# Patient Record
Sex: Female | Born: 1970 | Race: White | Hispanic: No | Marital: Single | Smoking: Never smoker
Health system: Southern US, Community
[De-identification: ages and names within clinical notes are randomized; demographics above are authoritative.]

## PROBLEM LIST (undated history)

## (undated) DIAGNOSIS — K8050 Calculus of bile duct without cholangitis or cholecystitis without obstruction: Secondary | ICD-10-CM

## (undated) DIAGNOSIS — R3915 Urgency of urination: Secondary | ICD-10-CM

## (undated) DIAGNOSIS — Z973 Presence of spectacles and contact lenses: Secondary | ICD-10-CM

## (undated) DIAGNOSIS — K8020 Calculus of gallbladder without cholecystitis without obstruction: Secondary | ICD-10-CM

## (undated) DIAGNOSIS — R350 Frequency of micturition: Secondary | ICD-10-CM

## (undated) DIAGNOSIS — R112 Nausea with vomiting, unspecified: Secondary | ICD-10-CM

## (undated) DIAGNOSIS — Z9889 Other specified postprocedural states: Secondary | ICD-10-CM

## (undated) HISTORY — PX: OTHER SURGICAL HISTORY: SHX169

---

## 2016-07-21 ENCOUNTER — Emergency Department (HOSPITAL_BASED_OUTPATIENT_CLINIC_OR_DEPARTMENT_OTHER): Payer: BC Managed Care – PPO

## 2016-07-21 ENCOUNTER — Emergency Department (HOSPITAL_BASED_OUTPATIENT_CLINIC_OR_DEPARTMENT_OTHER)
Admission: EM | Admit: 2016-07-21 | Discharge: 2016-07-22 | Disposition: A | Discharge status: Home / Self Care | Payer: BC Managed Care – PPO | Attending: Emergency Medicine | Admitting: Emergency Medicine

## 2016-07-21 ENCOUNTER — Encounter (HOSPITAL_BASED_OUTPATIENT_CLINIC_OR_DEPARTMENT_OTHER): Payer: Self-pay

## 2016-07-21 DIAGNOSIS — K8020 Calculus of gallbladder without cholecystitis without obstruction: Secondary | ICD-10-CM

## 2016-07-21 DIAGNOSIS — K8021 Calculus of gallbladder without cholecystitis with obstruction: Secondary | ICD-10-CM | POA: Insufficient documentation

## 2016-07-21 DIAGNOSIS — R1084 Generalized abdominal pain: Secondary | ICD-10-CM | POA: Diagnosis present

## 2016-07-21 DIAGNOSIS — R079 Chest pain, unspecified: Secondary | ICD-10-CM | POA: Insufficient documentation

## 2016-07-21 DIAGNOSIS — K838 Other specified diseases of biliary tract: Secondary | ICD-10-CM | POA: Insufficient documentation

## 2016-07-21 LAB — CBC WITH DIFFERENTIAL/PLATELET
BASOS ABS: 0 10*3/uL (ref 0.0–0.1)
Basophils Relative: 0 %
EOS PCT: 0 %
Eosinophils Absolute: 0 10*3/uL (ref 0.0–0.7)
HEMATOCRIT: 38.3 % (ref 36.0–46.0)
Hemoglobin: 13.1 g/dL (ref 12.0–15.0)
LYMPHS ABS: 0.5 10*3/uL — AB (ref 0.7–4.0)
LYMPHS PCT: 4 %
MCH: 30.8 pg (ref 26.0–34.0)
MCHC: 34.2 g/dL (ref 30.0–36.0)
MCV: 89.9 fL (ref 78.0–100.0)
MONO ABS: 0.4 10*3/uL (ref 0.1–1.0)
MONOS PCT: 3 %
NEUTROS ABS: 11.6 10*3/uL — AB (ref 1.7–7.7)
Neutrophils Relative %: 93 %
PLATELETS: 209 10*3/uL (ref 150–400)
RBC: 4.26 MIL/uL (ref 3.87–5.11)
RDW: 13.4 % (ref 11.5–15.5)
WBC: 12.5 10*3/uL — ABNORMAL HIGH (ref 4.0–10.5)

## 2016-07-21 LAB — URINALYSIS, ROUTINE W REFLEX MICROSCOPIC
BILIRUBIN URINE: NEGATIVE
Glucose, UA: NEGATIVE mg/dL
Nitrite: NEGATIVE
PROTEIN: NEGATIVE mg/dL
SPECIFIC GRAVITY, URINE: 1.026 (ref 1.005–1.030)
pH: 6 (ref 5.0–8.0)

## 2016-07-21 LAB — COMPREHENSIVE METABOLIC PANEL
ALT: 25 U/L (ref 14–54)
AST: 25 U/L (ref 15–41)
Albumin: 4.3 g/dL (ref 3.5–5.0)
Alkaline Phosphatase: 70 U/L (ref 38–126)
Anion gap: 10 (ref 5–15)
BILIRUBIN TOTAL: 0.7 mg/dL (ref 0.3–1.2)
BUN: 15 mg/dL (ref 6–20)
CHLORIDE: 104 mmol/L (ref 101–111)
CO2: 23 mmol/L (ref 22–32)
CREATININE: 0.66 mg/dL (ref 0.44–1.00)
Calcium: 9 mg/dL (ref 8.9–10.3)
Glucose, Bld: 151 mg/dL — ABNORMAL HIGH (ref 65–99)
POTASSIUM: 3.7 mmol/L (ref 3.5–5.1)
Sodium: 137 mmol/L (ref 135–145)
TOTAL PROTEIN: 7.6 g/dL (ref 6.5–8.1)

## 2016-07-21 LAB — URINALYSIS, MICROSCOPIC (REFLEX)

## 2016-07-21 LAB — LIPASE, BLOOD: LIPASE: 19 U/L (ref 11–51)

## 2016-07-21 LAB — PREGNANCY, URINE: PREG TEST UR: NEGATIVE

## 2016-07-21 LAB — TROPONIN I: Troponin I: <0.03 ng/mL (ref <0.03)

## 2016-07-21 LAB — I-STAT CG4 LACTIC ACID, ED: LACTIC ACID, VENOUS: 1.2 mmol/L (ref 0.5–1.9)

## 2016-07-21 MED ORDER — MORPHINE SULFATE (PF) 4 MG/ML IV SOLN
4.0000 mg | Freq: Once | INTRAVENOUS | Status: AC
  Administered 2016-07-21: 4 mg via INTRAVENOUS
  Filled 2016-07-21: qty 1

## 2016-07-21 MED ORDER — ONDANSETRON HCL 4 MG/2ML IJ SOLN
4.0000 mg | Freq: Once | INTRAMUSCULAR | Status: AC
  Administered 2016-07-21: 4 mg via INTRAVENOUS
  Filled 2016-07-21: qty 2

## 2016-07-21 MED ORDER — IOPAMIDOL (ISOVUE-300) INJECTION 61%
100.0000 mL | Freq: Once | INTRAVENOUS | Status: AC | PRN
  Administered 2016-07-21: 100 mL via INTRAVENOUS

## 2016-07-21 NOTE — ED Notes (Signed)
Unsuccessful IV attempt in left FA.

## 2016-07-21 NOTE — ED Notes (Signed)
Patient returned from CT

## 2016-07-21 NOTE — ED Notes (Signed)
Oral Contrast completed

## 2016-07-21 NOTE — ED Notes (Addendum)
Unsuccessful attempt at IV right upper arm

## 2016-07-21 NOTE — ED Notes (Signed)
Patient states she was feeling bad at school today.  When she went home about 4 stated she was not feeling well.  About 5pm she started with N/V/D.  Also states she has extremely bad abd pain that startes in the epigastric area and travels to her mid abd area.  Patient tearful

## 2016-07-21 NOTE — ED Triage Notes (Signed)
C.o abd pain, n/v/d since 3pm-NAD-steady gait

## 2016-07-21 NOTE — ED Triage Notes (Signed)
Pt states no med hx-surgical hx "lots of reconstructive surgery" r/t to [facility] age 46

## 2016-07-21 NOTE — ED Provider Notes (Signed)
[facility] Note   CSN: [id number] Arrival date & time: 07/21/16  2039  By signing my name below, I, Modena Jansky, attest that this documentation has been prepared under the direction and in the presence of non-physician practitioner, Frederik Pear, PA-C. Electronically Signed: Modena Jansky, Scribe. 07/21/2016. 10:10 PM.  History   Chief Complaint Chief Complaint  Patient presents with  . Abdominal Pain   The history is provided by the patient. No language interpreter was used.   HPI Comments: Morgan Bowman is a 46 y.o. female who presents to the Emergency Department complaining of constant moderate central abdominal pain that started about 5 hours ago. She states she was feeling sick when she onset of pain then later associated nausea, vomiting, and diarrhea. Her pain improved some after vomiting and diarrhea. No hematemesis, bilious emesis, hematochezia, or melena. Her pain radiates to upward into her chest with undescribed pain quality. No h/o of similar pain. Denies any hx of abdominal surgeries, recent unusual foods, fever, chills, heart palpitations, SOB, dysuria, vaginal bleeding/discharge, rectal pain, numbness, weakness, headache, or other complaints at this time. No treatment PTA. No pertinent PMH. Allergies to medication include Demerol.   History reviewed. No pertinent past medical history.  There are no active problems to display for this patient.   Past Surgical History:  Procedure Laterality Date  . Facial Surgeries      OB History    No data available       Home Medications    Prior to Admission medications   Medication Sig Start Date End Date Taking? Authorizing Provider  HYDROcodone-acetaminophen (NORCO/VICODIN) 5-325 MG tablet Take 1 tablet by mouth every 6 (six) hours as needed. 07/22/16   Trevionne Advani A, PA-C  ondansetron (ZOFRAN) 4 MG tablet Take 1 tablet (4 mg total) by mouth every 6 (six) hours. 07/22/16   Galina Haddox A, PA-C     Family History No family history on file.  Social History Social History  Substance Use Topics  . Smoking status: Never Smoker  . Smokeless tobacco: Never Used  . Alcohol use Yes     Comment: occ     Allergies   Demerol [meperidine]   Review of Systems Review of Systems  Constitutional: Negative for activity change, chills and fever.  Respiratory: Negative for shortness of breath.   Cardiovascular: Positive for chest pain. Negative for palpitations.  Gastrointestinal: Positive for abdominal pain, diarrhea, nausea and vomiting. Negative for blood in stool and rectal pain.  Genitourinary: Negative for dysuria, vaginal bleeding and vaginal discharge.  Musculoskeletal: Negative for back pain.  Skin: Negative for rash.  Neurological: Negative for weakness, numbness and headaches.   Physical Exam Updated Vital Signs BP (!) 120/93 (BP Location: Left Arm)   Pulse 88   Temp 97.9 F (36.6 C) (Oral)   Resp 18   Ht 5\' 5"  (1.651 m)   Wt 218 lb (98.9 kg)   LMP 06/22/2016   SpO2 100%   BMI 36.28 kg/m   Physical Exam  Constitutional:  Uncomfortable appearing.   HENT:  Head: Normocephalic.  Mouth/Throat: Uvula is midline.  Mucous membranes appear dry.  Eyes: Conjunctivae are normal.  Neck: Neck supple.  Cardiovascular: Normal rate, regular rhythm, normal heart sounds and intact distal pulses.  Exam reveals no gallop and no friction rub.   No murmur heard. Pulmonary/Chest: Effort normal and breath sounds normal. No respiratory distress. She has no wheezes. She has no rales.  Abdominal: Soft. She exhibits no distension. There  is tenderness. There is no rebound and no guarding.  Diffuse, generalized TTP, worse over the epigastric area and LUQ. No CVA tenderness bilaterally.   Musculoskeletal: She exhibits no edema.  No mid-line bony tenderness to the cervical, thoracic, or lumbar spine or the surrounding paraspinal muscles. No bilateral lower extremity edema.    Neurological: She is alert.  Skin: Skin is warm. Capillary refill takes 2 to 3 seconds. No rash noted.  Psychiatric: Her behavior is normal.  Nursing note and vitals reviewed.  ED Treatments / Results  DIAGNOSTIC STUDIES: Oxygen Saturation is 100% on RA, normal by my interpretation.    COORDINATION OF CARE: 10:14 PM- Pt advised of plan for treatment and pt agrees.  Labs (all labs ordered are listed, but only abnormal results are displayed) Labs Reviewed  URINALYSIS, ROUTINE W REFLEX MICROSCOPIC - Abnormal; Notable for the following:       Result Value   APPearance CLOUDY (*)    Hgb urine dipstick LARGE (*)    Ketones, ur >80 (*)    Leukocytes, UA TRACE (*)    All other components within normal limits  URINALYSIS, MICROSCOPIC (REFLEX) - Abnormal; Notable for the following:    Bacteria, UA FEW (*)    Squamous Epithelial / LPF 6-30 (*)    All other components within normal limits  CBC WITH DIFFERENTIAL/PLATELET - Abnormal; Notable for the following:    WBC 12.5 (*)    Neutro Abs 11.6 (*)    Lymphs Abs 0.5 (*)    All other components within normal limits  COMPREHENSIVE METABOLIC PANEL - Abnormal; Notable for the following:    Glucose, Bld 151 (*)    All other components within normal limits  PREGNANCY, URINE  LIPASE, BLOOD  TROPONIN I  I-STAT CG4 LACTIC ACID, ED    EKG  EKG Interpretation  Date/Time:  Wednesday Jul 21 2016 22:55:45 EDT Ventricular Rate:  78 PR Interval:    QRS Duration: 87 QT Interval:  424 QTC Calculation: 483 R Axis:   38 Text Interpretation:  Sinus rhythm No previous ECGs available Confirmed by Vanetta MuldersZackowski, Scott [phone]([id number]) on 07/22/2016 11:16:42 AM       Radiology Ct Chest W Contrast  Result Date: 07/22/2016 CLINICAL DATA:  Acute onset of epigastric abdominal pain, nausea, vomiting and diarrhea. Initial encounter. EXAM: CT CHEST, ABDOMEN, AND PELVIS WITH CONTRAST TECHNIQUE: Multidetector CT imaging of the chest, abdomen and pelvis was performed  following the standard protocol during bolus administration of intravenous contrast. CONTRAST:  150mL ISOVUE-300 IOPAMIDOL (ISOVUE-300) INJECTION 61% COMPARISON:  None. FINDINGS: CT CHEST FINDINGS Cardiovascular: The heart is normal in size. The thoracic aorta is unremarkable in appearance. No calcific atherosclerotic disease is seen. There is no evidence of aortic injury. There is no evidence of venous hemorrhage. The great vessels are grossly unremarkable. Mediastinum/Nodes: The mediastinum is unremarkable in appearance. No mediastinal lymphadenopathy is seen. No pericardial effusion is identified. The thyroid gland is unremarkable. No axillary lymphadenopathy is appreciated. Lungs/Pleura: Minimal bibasilar atelectasis is noted. The lungs are otherwise clear. No pleural effusion or pneumothorax is seen. No masses are identified. There is no evidence of pulmonary parenchymal contusion. Musculoskeletal: No acute osseous abnormalities are identified. The visualized musculature is unremarkable in appearance. CT ABDOMEN PELVIS FINDINGS Hepatobiliary: The liver is unremarkable in appearance. There is mild distention of the common bile duct to 8 mm. An apparent 1.0 cm stone is seen at the cystic duct. Stones are noted dependently within the gallbladder. There is vague soft tissue inflammation  about the intrahepatic biliary ducts, raising question for cholangitis. Pancreas: The pancreas is within normal limits. Spleen: The spleen is unremarkable in appearance. Adrenals/Urinary Tract: The adrenal glands are unremarkable in appearance. The kidneys are within normal limits. There is no evidence of hydronephrosis. No renal or ureteral stones are identified. No perinephric stranding is seen. Stomach/Bowel: The stomach is unremarkable in appearance. The small bowel is within normal limits. The appendix is normal in caliber, without evidence of appendicitis. The colon is unremarkable in appearance. Vascular/Lymphatic: The  abdominal aorta is unremarkable in appearance. The inferior vena cava is grossly unremarkable. No retroperitoneal lymphadenopathy is seen. No pelvic sidewall lymphadenopathy is identified. Reproductive: The bladder is mildly distended and within normal limits. The uterus is grossly unremarkable in appearance. The ovaries are relatively symmetric. No suspicious adnexal masses are seen. Other: No additional soft tissue abnormalities are seen. Musculoskeletal: No acute osseous abnormalities are identified. The visualized musculature is unremarkable in appearance. IMPRESSION: 1. Mild distention of the common bile duct to 8 mm. Apparent 1.0 cm stone noted at the cystic duct, raising concern for intermittent obstruction. Cholelithiasis noted. Vague soft tissue inflammation about the intrahepatic biliary ducts raises concern for cholangitis. Would correlate with LFTs. Given distention of the common bile duct, MRCP or ERCP could be considered for further evaluation. 2. Minimal bibasilar atelectasis noted.  Lungs otherwise clear. Electronically Signed   By: Roanna Raider M.D.   On: 07/22/2016 01:26   Ct Abdomen Pelvis W Contrast  Result Date: 07/22/2016 CLINICAL DATA:  Acute onset of epigastric abdominal pain, nausea, vomiting and diarrhea. Initial encounter. EXAM: CT CHEST, ABDOMEN, AND PELVIS WITH CONTRAST TECHNIQUE: Multidetector CT imaging of the chest, abdomen and pelvis was performed following the standard protocol during bolus administration of intravenous contrast. CONTRAST:  ISOVUE-300 IOPAMIDOL (ISOVUE-300) INJECTION 61% COMPARISON:  None. FINDINGS: CT CHEST FINDINGS Cardiovascular: The heart is normal in size. The thoracic aorta is unremarkable in appearance. No calcific atherosclerotic disease is seen. There is no evidence of aortic injury. There is no evidence of venous hemorrhage. The great vessels are grossly unremarkable. Mediastinum/Nodes: The mediastinum is unremarkable in appearance. No  mediastinal lymphadenopathy is seen. No pericardial effusion is identified. The thyroid gland is unremarkable. No axillary lymphadenopathy is appreciated. Lungs/Pleura: Minimal bibasilar atelectasis is noted. The lungs are otherwise clear. No pleural effusion or pneumothorax is seen. No masses are identified. There is no evidence of pulmonary parenchymal contusion. Musculoskeletal: No acute osseous abnormalities are identified. The visualized musculature is unremarkable in appearance. CT ABDOMEN PELVIS FINDINGS Hepatobiliary: The liver is unremarkable in appearance. There is mild distention of the common bile duct to 8 mm. An apparent 1.0 cm stone is seen at the cystic duct. Stones are noted dependently within the gallbladder. There is vague soft tissue inflammation about the intrahepatic biliary ducts, raising question for cholangitis. Pancreas: The pancreas is within normal limits. Spleen: The spleen is unremarkable in appearance. Adrenals/Urinary Tract: The adrenal glands are unremarkable in appearance. The kidneys are within normal limits. There is no evidence of hydronephrosis. No renal or ureteral stones are identified. No perinephric stranding is seen. Stomach/Bowel: The stomach is unremarkable in appearance. The small bowel is within normal limits. The appendix is normal in caliber, without evidence of appendicitis. The colon is unremarkable in appearance. Vascular/Lymphatic: The abdominal aorta is unremarkable in appearance. The inferior vena cava is grossly unremarkable. No retroperitoneal lymphadenopathy is seen. No pelvic sidewall lymphadenopathy is identified. Reproductive: The bladder is mildly distended and within normal limits. The  uterus is grossly unremarkable in appearance. The ovaries are relatively symmetric. No suspicious adnexal masses are seen. Other: No additional soft tissue abnormalities are seen. Musculoskeletal: No acute osseous abnormalities are identified. The visualized musculature is  unremarkable in appearance. IMPRESSION: 1. Mild distention of the common bile duct to 8 mm. Apparent 1.0 cm stone noted at the cystic duct, raising concern for intermittent obstruction. Cholelithiasis noted. Vague soft tissue inflammation about the intrahepatic biliary ducts raises concern for cholangitis. Would correlate with LFTs. Given distention of the common bile duct, MRCP or ERCP could be considered for further evaluation. 2. Minimal bibasilar atelectasis noted.  Lungs otherwise clear. Electronically Signed   By: Roanna Raider M.D.   On: 07/22/2016 01:26    Procedures Procedures (including critical care time)  Medications Ordered in ED Medications  ondansetron [facility]) injection 4 mg (4 mg Intravenous Given 07/21/16 2203)  morphine 4 MG/ML injection 4 mg (4 mg Intravenous Given 07/21/16 2204)  iopamidol (ISOVUE-300) 61 % injection 100 mL (100 mLs Intravenous Contrast Given 07/21/16 2339)  morphine 4 MG/ML injection 4 mg (4 mg Intravenous Given 07/22/16 0012)  iopamidol (ISOVUE-300) 61 % injection 50 mL (50 mLs Intravenous Contrast Given 07/22/16 0034)  sodium chloride 0.9 % bolus 1,000 mL (0 mLs Intravenous Stopped 07/22/16 0223)     Initial Impression / Assessment and Plan / ED Course  I have reviewed the triage vital signs and the nursing notes.  Pertinent labs & imaging results that were available during my care of the patient were reviewed by me and considered in my medical decision making (see chart for details).     Patient with cholelithiasis and common bile duct dilatation noted on CT with concern for intermittent obstruction of the cystic duct. Transaminases and bililrubin are not elevated at this time. On initial exam, the patient appeared dehydrated. Much improved after IVF. Pain controlled with morphine. Nausea controlled with Zofran. Lab work is reassuring. VSS. Will d/c the patient to home with pain control and Zofran and follow up to GI and general surgery. Strict return  precautions given. The patient appears stable for discharge at this time.   Final Clinical Impressions(s) / ED Diagnoses   Final diagnoses:  Common bile duct dilatation  Cholelithiasis without cholecystitis    New Prescriptions Discharge Medication List as of 07/22/2016  1:53 AM    START taking these medications   Details  HYDROcodone-acetaminophen (NORCO/VICODIN) 5-325 MG tablet Take 1 tablet by mouth every 6 (six) hours as needed., Starting Thu 07/22/2016, Print    ondansetron (ZOFRAN) 4 MG tablet Take 1 tablet (4 mg total) by mouth every 6 (six) hours., Starting Thu 07/22/2016, Print       I personally performed the services described in this documentation, which was scribed in my presence. The recorded information has been reviewed and is accurate.     Frederik Pear A, PA-C 07/23/16 1324    Gwyneth Sprout, MD 07/23/16 Ernestina [place]

## 2016-07-22 MED ORDER — MORPHINE SULFATE (PF) 4 MG/ML IV SOLN
4.0000 mg | Freq: Once | INTRAVENOUS | Status: AC
  Administered 2016-07-22: 4 mg via INTRAVENOUS
  Filled 2016-07-22: qty 1

## 2016-07-22 MED ORDER — SODIUM CHLORIDE 0.9 % IV BOLUS (SEPSIS)
1000.0000 mL | Freq: Once | INTRAVENOUS | Status: AC
  Administered 2016-07-21: 1000 mL via INTRAVENOUS

## 2016-07-22 MED ORDER — IOPAMIDOL (ISOVUE-300) INJECTION 61%
50.0000 mL | Freq: Once | INTRAVENOUS | Status: AC | PRN
  Administered 2016-07-22: 50 mL via INTRAVENOUS

## 2016-07-22 MED ORDER — HYDROCODONE-ACETAMINOPHEN 5-325 MG PO TABS: 1.0000 | ORAL_TABLET | Freq: Four times a day (QID) | ORAL | 0 refills | Status: DC | PRN

## 2016-07-22 MED ORDER — ONDANSETRON HCL 4 MG PO TABS: 4.0000 mg | ORAL_TABLET | Freq: Four times a day (QID) | ORAL | 0 refills | Status: DC

## 2016-07-22 NOTE — ED Notes (Signed)
Patient states her pain is more constant now

## 2016-07-22 NOTE — ED Notes (Signed)
Patient ambulatory to the BR without difficulty 

## 2016-07-22 NOTE — ED Notes (Signed)
Discharge instructions and prescriptions reviewed - voiced understanding 

## 2016-08-13 ENCOUNTER — Ambulatory Visit: Payer: Self-pay | Admitting: Surgery

## 2016-08-13 ENCOUNTER — Encounter (HOSPITAL_BASED_OUTPATIENT_CLINIC_OR_DEPARTMENT_OTHER): Payer: Self-pay | Admitting: *Deleted

## 2016-08-13 NOTE — H&P (Signed)
Morgan Bowman 08/13/2016 1:39 PM Location: [facility] Patient #: [id number] DOB: 01/25/1971 Single / Language: English / Race: White Female  History of Present Illness (Morgan Bowman A. Delainee Tramel MD; 08/13/2016 2:11 PM) Patient words: Chief complaint: abdominal pain  This is an otherwise healthy 46-year-old woman who is referred from the emergency department for symptomatic cholelithiasis. She presented to the ER on May 30 with a 5 hour history of constant moderate central abdominal pain. This was associated with nausea, vomiting and diarrhea. The pain did improve somewhat. The pain radiated upward to her chest. She had never had any prior similar symptoms. After this she went on a cruise and when she returned she had another bout of pain which is actually lasted for the majority of this week, she feels better today actually. She's not had any fevers. She's been sticking to the brat diet. The pain sort of ebbs and wanes, she has not identified any particular triggers or aggravating factors.  Her workup in the ER included a CMP and CBC. She did have a mild leukocytosis to 12.5 with increased neutrophil component, but the remainder of her labs were unremarkable including bilirubin and liver enzymes; her urinalysis did shoe ketonuria. She underwent CT chest/abdomen/pelvis which showed mild distention of the common bile duct to 8 mm and a 1 cm stone at the cystic duct in addition to dependent stones in the gallbladder. They describe a vague soft tissue inflammation around the intrahepatic biliary ducts raising the question for cholangitis.  She has never had any abdominal surgeries. She is a highschool teacher. She is going on a field trip to [place] on July 9.  The patient is a 46 year old female.   Past Surgical History (Armen Glenn, CMA; 08/13/2016 1:39 PM) No pertinent past surgical history  Diagnostic Studies History (Armen Glenn, CMA; 08/13/2016 1:39 PM) Colonoscopy  never Mammogram never Pap Smear 1-5 years ago  Allergies (Armen Glenn, CMA; 08/13/2016 1:43 PM) Demerol *ANALGESICS - OPIOID* Rash.  Medication History (Armen Glenn, CMA; 08/13/2016 1:43 PM) Ondansetron HCl (4MG Tablet, Oral) Active. Hydrocodone-Acetaminophen (5-325MG Tablet, Oral) Active. Medications Reconciled  Social History (Armen Glenn, CMA; 08/13/2016 1:39 PM) Alcohol use Occasional alcohol use. Caffeine use Carbonated beverages. No drug use Tobacco use Never smoker.  Family History (Armen Glenn, CMA; 08/13/2016 1:39 PM) Family history unknown First Degree Relatives  Pregnancy / Birth History (Armen Glenn, CMA; 08/13/2016 1:39 PM) Age at menarche 13 years. Contraceptive History Oral contraceptives. Gravida 0 Para 0 Regular periods  Other Problems (Armen Glenn, CMA; 08/13/2016 1:39 PM) High blood pressure     Review of Systems (Zettie Gootee A. Kelby Lotspeich MD; 08/13/2016 2:06 PM) General Present- Appetite Loss and Weight Loss. Not Present- Chills, Fatigue, Fever, Night Sweats and Weight Gain. Skin Not Present- Change in Wart/Mole, Dryness, Hives, Jaundice, New Lesions, Non-Healing Wounds, Rash and Ulcer. HEENT Present- Seasonal Allergies. Not Present- Earache, Hearing Loss, Hoarseness, Nose Bleed, Oral Ulcers, Ringing in the Ears, Sinus Pain, Sore Throat, Visual Disturbances, Wears glasses/contact lenses and Yellow Eyes. Respiratory Not Present- Bloody sputum, Chronic Cough, Difficulty Breathing, Snoring and Wheezing. Breast Not Present- Breast Mass, Breast Pain, Nipple Discharge and Skin Changes. Cardiovascular Not Present- Chest Pain, Difficulty Breathing Lying Down, Leg Cramps, Palpitations, Rapid Heart Rate, Shortness of Breath and Swelling of Extremities. Gastrointestinal Present- Abdominal Pain, Bloating and Indigestion. Not Present- Bloody Stool, Change in Bowel Habits, Chronic diarrhea, Constipation, Difficulty Swallowing, Excessive gas, Gets full quickly at  meals, Hemorrhoids, Nausea, Rectal Pain and Vomiting. Female Genitourinary   Present- Painful Urination and Urgency. Not Present- Frequency, Nocturia and Pelvic Pain. Musculoskeletal Not Present- Back Pain, Joint Pain, Joint Stiffness, Muscle Pain, Muscle Weakness and Swelling of Extremities. Neurological Not Present- Decreased Memory, Fainting, Headaches, Numbness, Seizures, Tingling, Tremor, Trouble walking and Weakness. Psychiatric Not Present- Anxiety, Bipolar, Change in Sleep Pattern, Depression, Fearful and Frequent crying. Endocrine Not Present- Cold Intolerance, Excessive Hunger, Hair Changes, Heat Intolerance, Hot flashes and New Diabetes. Hematology Not Present- Blood Thinners, Easy Bruising, Excessive bleeding, Gland problems, HIV and Persistent Infections. All other systems negative  Vitals (Armen Glenn CMA; 08/13/2016 1:41 PM) 08/13/2016 1:40 PM Weight: 213 lb Height: 65in Body Surface Area: 2.03 m Body Mass Index: 35.44 kg/m  Temp.: 98.3F  Pulse: 120 (Regular)  Resp.: 97 (Unlabored)  BP: 116/92 (Sitting, Left Arm, Standard)      Physical Exam (Abel Hageman A. Lakelyn Straus MD; 08/13/2016 2:10 PM)  General Note: She is alert and well-appearing  Integumentary Note: Skin is warm and dry  Head and Neck Note: No mass or thyromegaly  Eye Note: No scleral icterus. Pupils equally round and reactive  ENMT Note: Several scars around her face. Normal hearing. Oral mucosa is moist and she has normal dentition  Chest and Lung Exam Note: Unlabored respirations, no tactile fremitus  Cardiovascular Note: Regular rate and rhythm. Palpable pedal pulses. No pedal edema  Abdomen Note: Soft, nontender, nondistended. No mass or organomegaly.  Neurologic Note: Grossly intact, normal gait.  Neuropsychiatric Note: Normal judgment and insight. Intact recent and remote memory.  Musculoskeletal Note: Normal gait. No clubbing or cyanosis. Normal range of motion and  muscle tone throughout.    Assessment & Plan (Clarabell Matsuoka A. Finnlee Silvernail MD; 08/13/2016 2:12 PM)  BILIARY COLIC (K80.50) Story: We discussed the natural history of gallstones and the possibilities of ongoing pain, cholecystitis, cholangitis or pancreatitis in the symptoms of each of these. I counseled her as to the technical details of laparoscopic cholecystectomy and advised her as to the risks of surgery including bleeding, pain, scarring, infection, intra-abdominal injury specifically to the common bile duct and sequelae, and conversion to open surgery. She is understanding and she had several insightful questions all of which were answered. Given her escalating symptoms I do believe that cholecystectomy should be pursued in the near future. We will try to get her scheduled in the next week or 2 

## 2016-08-13 NOTE — Progress Notes (Signed)
NPO AFTER MN.  ARRIVE AT 0930.  CURRENT LAB RESULTS AND EKG IN CHART AND EPIC.  MAY TAKE NORCO/ ZOFRAN IF NEEDED AM DOS W/ SIPS OF WATER.  PT VERBALIZED UNDERSTANDING TO SHOWER W/ HIBICLENS HS BEFORE AND AM DOS.

## 2016-08-16 ENCOUNTER — Encounter (HOSPITAL_BASED_OUTPATIENT_CLINIC_OR_DEPARTMENT_OTHER)
Admission: RE | Disposition: A | Discharge status: Home / Self Care | Payer: Self-pay | Source: Ambulatory Visit | Attending: Surgery

## 2016-08-16 ENCOUNTER — Encounter (HOSPITAL_BASED_OUTPATIENT_CLINIC_OR_DEPARTMENT_OTHER): Payer: Self-pay | Admitting: *Deleted

## 2016-08-16 ENCOUNTER — Ambulatory Visit (HOSPITAL_BASED_OUTPATIENT_CLINIC_OR_DEPARTMENT_OTHER)
Admission: RE | Admit: 2016-08-16 | Discharge: 2016-08-16 | Disposition: A | Discharge status: Home / Self Care | Payer: BC Managed Care – PPO | Source: Ambulatory Visit | Attending: Surgery | Admitting: Surgery

## 2016-08-16 ENCOUNTER — Ambulatory Visit (HOSPITAL_BASED_OUTPATIENT_CLINIC_OR_DEPARTMENT_OTHER): Payer: BC Managed Care – PPO | Admitting: Anesthesiology

## 2016-08-16 DIAGNOSIS — K8012 Calculus of gallbladder with acute and chronic cholecystitis without obstruction: Secondary | ICD-10-CM | POA: Diagnosis not present

## 2016-08-16 DIAGNOSIS — Z6835 Body mass index (BMI) 35.0-35.9, adult: Secondary | ICD-10-CM | POA: Diagnosis not present

## 2016-08-16 DIAGNOSIS — E669 Obesity, unspecified: Secondary | ICD-10-CM | POA: Insufficient documentation

## 2016-08-16 HISTORY — DX: Presence of spectacles and contact lenses: Z97.3

## 2016-08-16 HISTORY — DX: Calculus of bile duct without cholangitis or cholecystitis without obstruction: K80.50

## 2016-08-16 HISTORY — DX: Nausea with vomiting, unspecified: Z98.890

## 2016-08-16 HISTORY — DX: Calculus of gallbladder without cholecystitis without obstruction: K80.20

## 2016-08-16 HISTORY — DX: Other specified postprocedural states: R11.2

## 2016-08-16 HISTORY — DX: Urgency of urination: R39.15

## 2016-08-16 HISTORY — PX: CHOLECYSTECTOMY: SHX55

## 2016-08-16 HISTORY — DX: Frequency of micturition: R35.0

## 2016-08-16 LAB — POCT PREGNANCY, URINE: PREG TEST UR: NEGATIVE

## 2016-08-16 SURGERY — LAPAROSCOPIC CHOLECYSTECTOMY
Anesthesia: General | Site: Abdomen

## 2016-08-16 MED ORDER — ROCURONIUM BROMIDE 50 MG/5ML IV SOSY
PREFILLED_SYRINGE | INTRAVENOUS | Status: DC | PRN
  Administered 2016-08-16: 30 mg via INTRAVENOUS
  Administered 2016-08-16: 10 mg via INTRAVENOUS

## 2016-08-16 MED ORDER — DEXAMETHASONE SODIUM PHOSPHATE 10 MG/ML IJ SOLN
INTRAMUSCULAR | Status: AC
  Filled 2016-08-16: qty 1

## 2016-08-16 MED ORDER — ACETAMINOPHEN 500 MG PO TABS
1000.0000 mg | ORAL_TABLET | ORAL | Status: AC
  Administered 2016-08-16: 1000 mg via ORAL
  Filled 2016-08-16: qty 2

## 2016-08-16 MED ORDER — SCOPOLAMINE 1 MG/3DAYS TD PT72
1.0000 | MEDICATED_PATCH | TRANSDERMAL | Status: DC
  Administered 2016-08-16: 1.5 mg via TRANSDERMAL
  Filled 2016-08-16: qty 1

## 2016-08-16 MED ORDER — LIDOCAINE HCL 4 % MT SOLN
OROMUCOSAL | Status: DC | PRN
  Administered 2016-08-16: 2 mL via TOPICAL

## 2016-08-16 MED ORDER — AMOXICILLIN-POT CLAVULANATE 875-125 MG PO TABS: 1.0000 | ORAL_TABLET | Freq: Two times a day (BID) | ORAL | 0 refills | Status: AC

## 2016-08-16 MED ORDER — SODIUM CHLORIDE 0.9 % IV SOLN
250.0000 mL | INTRAVENOUS | Status: DC | PRN
  Filled 2016-08-16: qty 250

## 2016-08-16 MED ORDER — SUCCINYLCHOLINE CHLORIDE 200 MG/10ML IV SOSY
PREFILLED_SYRINGE | INTRAVENOUS | Status: AC
  Filled 2016-08-16: qty 10

## 2016-08-16 MED ORDER — SUCCINYLCHOLINE CHLORIDE 200 MG/10ML IV SOSY
PREFILLED_SYRINGE | INTRAVENOUS | Status: DC | PRN
  Administered 2016-08-16: 100 mg via INTRAVENOUS

## 2016-08-16 MED ORDER — ACETAMINOPHEN 500 MG PO TABS
ORAL_TABLET | ORAL | Status: AC
  Filled 2016-08-16: qty 2

## 2016-08-16 MED ORDER — ONDANSETRON HCL 4 MG/2ML IJ SOLN
INTRAMUSCULAR | Status: AC
  Filled 2016-08-16: qty 2

## 2016-08-16 MED ORDER — SUGAMMADEX SODIUM 200 MG/2ML IV SOLN
INTRAVENOUS | Status: AC
  Filled 2016-08-16: qty 2

## 2016-08-16 MED ORDER — LACTATED RINGERS IV SOLN
INTRAVENOUS | Status: DC
  Administered 2016-08-16 (×2): via INTRAVENOUS
  Filled 2016-08-16: qty 1000

## 2016-08-16 MED ORDER — SUGAMMADEX SODIUM 200 MG/2ML IV SOLN
INTRAVENOUS | Status: DC | PRN
  Administered 2016-08-16: 200 mg via INTRAVENOUS

## 2016-08-16 MED ORDER — CEFAZOLIN SODIUM-DEXTROSE 2-4 GM/100ML-% IV SOLN
2.0000 g | INTRAVENOUS | Status: AC
  Administered 2016-08-16: 2 g via INTRAVENOUS
  Filled 2016-08-16: qty 100

## 2016-08-16 MED ORDER — DOCUSATE SODIUM 100 MG PO CAPS: 100.0000 mg | ORAL_CAPSULE | Freq: Two times a day (BID) | ORAL | 0 refills | Status: AC

## 2016-08-16 MED ORDER — GABAPENTIN 300 MG PO CAPS
ORAL_CAPSULE | ORAL | Status: AC
Start: 2016-08-16 — End: ?
  Filled 2016-08-16: qty 1

## 2016-08-16 MED ORDER — SODIUM CHLORIDE 0.9% FLUSH
3.0000 mL | Freq: Two times a day (BID) | INTRAVENOUS | Status: DC
  Filled 2016-08-16: qty 3

## 2016-08-16 MED ORDER — KETOROLAC TROMETHAMINE 30 MG/ML IJ SOLN
30.0000 mg | Freq: Once | INTRAMUSCULAR | Status: DC | PRN
Start: 2016-08-16 — End: 2016-08-16
  Filled 2016-08-16: qty 1

## 2016-08-16 MED ORDER — PROMETHAZINE HCL 25 MG/ML IJ SOLN
6.2500 mg | INTRAMUSCULAR | Status: DC | PRN
  Filled 2016-08-16: qty 1

## 2016-08-16 MED ORDER — BUPIVACAINE-EPINEPHRINE 0.25% -1:200000 IJ SOLN
INTRAMUSCULAR | Status: DC | PRN
  Administered 2016-08-16: 30 mL

## 2016-08-16 MED ORDER — FENTANYL CITRATE (PF) 250 MCG/5ML IJ SOLN
INTRAMUSCULAR | Status: AC
  Filled 2016-08-16: qty 5

## 2016-08-16 MED ORDER — SCOPOLAMINE 1 MG/3DAYS TD PT72
MEDICATED_PATCH | TRANSDERMAL | Status: AC
  Filled 2016-08-16: qty 1

## 2016-08-16 MED ORDER — CELECOXIB 400 MG PO CAPS
400.0000 mg | ORAL_CAPSULE | ORAL | Status: AC
  Administered 2016-08-16: 400 mg via ORAL
  Filled 2016-08-16: qty 1

## 2016-08-16 MED ORDER — SODIUM CHLORIDE 0.9 % IR SOLN
Status: DC | PRN
  Administered 2016-08-16: 3000 mL

## 2016-08-16 MED ORDER — FENTANYL CITRATE (PF) 100 MCG/2ML IJ SOLN
INTRAMUSCULAR | Status: DC | PRN
  Administered 2016-08-16 (×6): 50 ug via INTRAVENOUS

## 2016-08-16 MED ORDER — CHLORHEXIDINE GLUCONATE 4 % EX LIQD
60.0000 mL | Freq: Once | CUTANEOUS | Status: DC
  Filled 2016-08-16: qty 118

## 2016-08-16 MED ORDER — OXYCODONE HCL 5 MG PO TABS
5.0000 mg | ORAL_TABLET | ORAL | Status: DC | PRN
  Filled 2016-08-16: qty 2

## 2016-08-16 MED ORDER — SODIUM CHLORIDE 0.9% FLUSH
3.0000 mL | INTRAVENOUS | Status: DC | PRN
  Filled 2016-08-16: qty 3

## 2016-08-16 MED ORDER — CELECOXIB 200 MG PO CAPS
ORAL_CAPSULE | ORAL | Status: AC
  Filled 2016-08-16: qty 2

## 2016-08-16 MED ORDER — ONDANSETRON HCL 4 MG/2ML IJ SOLN
INTRAMUSCULAR | Status: DC | PRN
Start: 2016-08-16 — End: 2016-08-16
  Administered 2016-08-16: 4 mg via INTRAVENOUS

## 2016-08-16 MED ORDER — HYDROMORPHONE HCL 1 MG/ML IJ SOLN
INTRAMUSCULAR | Status: AC
  Filled 2016-08-16: qty 0.5

## 2016-08-16 MED ORDER — HYDROCODONE-ACETAMINOPHEN 5-325 MG PO TABS: 1.0000 | ORAL_TABLET | Freq: Four times a day (QID) | ORAL | 0 refills | Status: DC | PRN

## 2016-08-16 MED ORDER — CEFAZOLIN SODIUM-DEXTROSE 2-4 GM/100ML-% IV SOLN
INTRAVENOUS | Status: AC
  Filled 2016-08-16: qty 100

## 2016-08-16 MED ORDER — MIDAZOLAM HCL 5 MG/5ML IJ SOLN
INTRAMUSCULAR | Status: DC | PRN
  Administered 2016-08-16: 2 mg via INTRAVENOUS

## 2016-08-16 MED ORDER — DEXAMETHASONE SODIUM PHOSPHATE 4 MG/ML IJ SOLN
INTRAMUSCULAR | Status: DC | PRN
  Administered 2016-08-16: 10 mg via INTRAVENOUS

## 2016-08-16 MED ORDER — PROPOFOL 10 MG/ML IV BOLUS
INTRAVENOUS | Status: AC
  Filled 2016-08-16: qty 40

## 2016-08-16 MED ORDER — MIDAZOLAM HCL 2 MG/2ML IJ SOLN
INTRAMUSCULAR | Status: AC
  Filled 2016-08-16: qty 2

## 2016-08-16 MED ORDER — ROCURONIUM BROMIDE 50 MG/5ML IV SOSY
PREFILLED_SYRINGE | INTRAVENOUS | Status: AC
  Filled 2016-08-16: qty 5

## 2016-08-16 MED ORDER — PROPOFOL 10 MG/ML IV BOLUS
INTRAVENOUS | Status: DC | PRN
Start: 2016-08-16 — End: 2016-08-16
  Administered 2016-08-16: 200 mg via INTRAVENOUS

## 2016-08-16 MED ORDER — ACETAMINOPHEN 325 MG PO TABS
650.0000 mg | ORAL_TABLET | ORAL | Status: DC | PRN
  Filled 2016-08-16: qty 2

## 2016-08-16 MED ORDER — GABAPENTIN 300 MG PO CAPS
300.0000 mg | ORAL_CAPSULE | ORAL | Status: AC
  Administered 2016-08-16: 300 mg via ORAL
  Filled 2016-08-16: qty 1

## 2016-08-16 MED ORDER — LIDOCAINE 2% (20 MG/ML) 5 ML SYRINGE
INTRAMUSCULAR | Status: DC | PRN
  Administered 2016-08-16: 80 mg via INTRAVENOUS

## 2016-08-16 MED ORDER — HYDROMORPHONE HCL 1 MG/ML IJ SOLN
0.2500 mg | INTRAMUSCULAR | Status: DC | PRN
  Administered 2016-08-16: 0.5 mg via INTRAVENOUS
  Filled 2016-08-16: qty 0.5

## 2016-08-16 MED ORDER — FENTANYL CITRATE (PF) 100 MCG/2ML IJ SOLN
25.0000 ug | INTRAMUSCULAR | Status: DC | PRN
  Filled 2016-08-16: qty 1

## 2016-08-16 MED ORDER — ACETAMINOPHEN 650 MG RE SUPP
650.0000 mg | RECTAL | Status: DC | PRN
  Filled 2016-08-16: qty 1

## 2016-08-16 MED FILL — AMOX TR-K CLV 875-125 MG TA: 875-125 | 7 days supply | Qty: 14 | Fill #0

## 2016-08-16 MED FILL — HYDROCODON-APAP 5-325: 5-325 | 7 days supply | Qty: 30 | Fill #0

## 2016-08-16 SURGICAL SUPPLY — 49 items
APPLIER CLIP 5 13 M/L LIGAMAX5 (MISCELLANEOUS)
APPLIER CLIP ROT 10 11.4 M/L (STAPLE) ×3
BAG RETRIEVAL 10 (BASKET) ×1
BAG RETRIEVAL 10MM (BASKET) ×1
CABLE HIGH FREQUENCY MONO STRZ (ELECTRODE) ×3 IMPLANT
CHLORAPREP W/TINT 26ML (MISCELLANEOUS) ×3 IMPLANT
CLIP APPLIE 5 13 M/L LIGAMAX5 (MISCELLANEOUS) IMPLANT
CLIP APPLIE ROT 10 11.4 M/L (STAPLE) ×1 IMPLANT
CLIP LIGATING HEMO LOK XL GOLD (MISCELLANEOUS) ×3 IMPLANT
COVER MAYO STAND STRL (DRAPES) IMPLANT
DECANTER SPIKE VIAL GLASS SM (MISCELLANEOUS) ×3 IMPLANT
DERMABOND ADVANCED (GAUZE/BANDAGES/DRESSINGS) ×8
DERMABOND ADVANCED .7 DNX12 (GAUZE/BANDAGES/DRESSINGS) ×4 IMPLANT
DEVICE PMI PUNCTURE CLOSURE (MISCELLANEOUS) IMPLANT
DRAPE C-ARM 42X72 X-RAY (DRAPES) ×3 IMPLANT
DRAPE UTILITY XL STRL (DRAPES) IMPLANT
GLOVE BIO SURGEON STRL SZ 6 (GLOVE) ×6 IMPLANT
GLOVE BIOGEL PI IND STRL 7.0 (GLOVE) ×1 IMPLANT
GLOVE BIOGEL PI IND STRL 7.5 (GLOVE) ×3 IMPLANT
GLOVE BIOGEL PI INDICATOR 7.0 (GLOVE) ×2
GLOVE BIOGEL PI INDICATOR 7.5 (GLOVE) ×6
GLOVE ECLIPSE 7.0 STRL STRAW (GLOVE) ×3 IMPLANT
GLOVE INDICATOR 6.5 STRL GRN (GLOVE) ×3 IMPLANT
GOWN STRL REUS W/TWL LRG LVL3 (GOWN DISPOSABLE) ×6 IMPLANT
GOWN STRL REUS W/TWL XL LVL3 (GOWN DISPOSABLE) ×3 IMPLANT
GRASPER SUT TROCAR 14GX15 (MISCELLANEOUS) ×3 IMPLANT
HEMOSTAT SNOW SURGICEL 2X4 (HEMOSTASIS) IMPLANT
IRRIG SUCT STRYKERFLOW 2 WTIP (MISCELLANEOUS) ×3
IRRIGATION SUCT STRKRFLW 2 WTP (MISCELLANEOUS) ×1 IMPLANT
IV NS IRRIG 3000ML ARTHROMATIC (IV SOLUTION) ×3 IMPLANT
NEEDLE HYPO 22GX1.5 SAFETY (NEEDLE) ×3 IMPLANT
NEEDLE INSUFFLATION 14GA 120MM (NEEDLE) ×3 IMPLANT
PACK BASIN DAY SURGERY FS (CUSTOM PROCEDURE TRAY) ×3 IMPLANT
POUCH SPECIMEN RETRIEVAL 10MM (ENDOMECHANICALS) ×3 IMPLANT
SCISSORS LAP 5X35 DISP (ENDOMECHANICALS) ×3 IMPLANT
SET CHOLANGIOGRAPH MIX (MISCELLANEOUS) IMPLANT
SLEEVE ENDOPATH XCEL 5M (ENDOMECHANICALS) IMPLANT
SLEEVE XCEL OPT CAN 5 100 (ENDOMECHANICALS) ×9 IMPLANT
SUT MNCRL AB 4-0 PS2 18 (SUTURE) ×3 IMPLANT
SYR CONTROL 10ML LL (SYRINGE) ×3 IMPLANT
SYS BAG RETRIEVAL 10MM (BASKET) ×1
SYSTEM BAG RETRIEVAL 10MM (BASKET) ×1 IMPLANT
TOWEL OR 17X24 6PK STRL BLUE (TOWEL DISPOSABLE) ×6 IMPLANT
TRAY LAPAROSCOPIC (CUSTOM PROCEDURE TRAY) ×3 IMPLANT
TROCAR BLADELESS OPT 5 100 (ENDOMECHANICALS) ×3 IMPLANT
TROCAR XCEL 12X100 BLDLESS (ENDOMECHANICALS) ×3 IMPLANT
TROCAR XCEL NON-BLD 11X100MML (ENDOMECHANICALS) ×3 IMPLANT
TROCAR XCEL NON-BLD 5MMX100MML (ENDOMECHANICALS) ×9 IMPLANT
TUBING INSUF HEATED (TUBING) ×3 IMPLANT

## 2016-08-16 NOTE — Anesthesia Postprocedure Evaluation (Signed)
Anesthesia Post Note  Patient: Morgan Bowman  Procedure(s) Performed: Procedure(s) (LRB): LAPAROSCOPIC CHOLECYSTECTOMY (N/A)     Patient location during evaluation: PACU Anesthesia Type: General Level of consciousness: awake and alert Pain management: pain level controlled Vital Signs Assessment: post-procedure vital signs reviewed and stable Respiratory status: spontaneous breathing, nonlabored ventilation, respiratory function stable and patient connected to nasal cannula oxygen Cardiovascular status: blood pressure returned to baseline and stable Postop Assessment: no signs of nausea or vomiting Anesthetic complications: no    Last Vitals:  Vitals:   08/16/16 1330 08/16/16 1340  BP: (!) 147/80   Pulse: 82 81  Resp: 20 20  Temp:      Last Pain:  Vitals:   08/16/16 1340  TempSrc:   PainSc: 2                  Kennieth RadFitzgerald, Gwenivere Hiraldo E

## 2016-08-16 NOTE — Interval H&P Note (Signed)
History and Physical Interval Note:  08/16/2016 10:04 AM  Morgan Bowman  has presented today for surgery, with the diagnosis of biliary colic  The various methods of treatment have been discussed with the patient and family. After consideration of risks, benefits and other options for treatment, the patient has consented to  Procedure(s): LAPAROSCOPIC CHOLECYSTECTOMY (N/A) as a surgical intervention .  The patient's history has been reviewed, patient examined, no change in status, stable for surgery.  I have reviewed the patient's chart and labs.  She has had no further pain or issues since I saw her last week. Questions were answered to the patient's satisfaction.     Chelsea Lollie SailsA Connor

## 2016-08-16 NOTE — Anesthesia Procedure Notes (Signed)
Procedure Name: Intubation Date/Time: 08/16/2016 10:46 AM Performed by: Suzette Battiest Pre-anesthesia Checklist: Patient identified, Emergency Drugs available, Suction available and Patient being monitored Patient Re-evaluated:Patient Re-evaluated prior to inductionOxygen Delivery Method: Circle system utilized Preoxygenation: Pre-oxygenation with 100% oxygen Intubation Type: IV induction and Cricoid Pressure applied Ventilation: Mask ventilation without difficulty Laryngoscope Size: Mac and 3 Grade View: Grade I Tube type: Oral Tube size: 7.0 mm Number of attempts: 1 Airway Equipment and Method: Stylet and LTA kit utilized Placement Confirmation: ETT inserted through vocal cords under direct vision,  positive ETCO2 and breath sounds checked- equal and bilateral Secured at: 21 cm Tube secured with: Tape Dental Injury: Teeth and Oropharynx as per pre-operative assessment  Comments: Limited mouth opening.  Able to insert MAC 3 laryngoscope into mouth without any trouble.

## 2016-08-16 NOTE — H&P (View-Only) (Signed)
Morgan Bowman 08/13/2016 1:39 PM Location: [facility] Patient #: [id number] DOB: Nov 08, 1970 Single / Language: [place] / Race: White Female  History of Present Illness (Hernando Reali A. Fredricka Bonine MD; 08/13/2016 2:11 PM) Patient words: Chief complaint: abdominal pain  This is an otherwise healthy 46 year old woman who is referred from the emergency department for symptomatic cholelithiasis. She presented to the ER on May 30 with a 5 hour history of constant moderate central abdominal pain. This was associated with nausea, vomiting and diarrhea. The pain did improve somewhat. The pain radiated upward to her chest. She had never had any prior similar symptoms. After this she went on a cruise and when she returned she had another bout of pain which is actually lasted for the majority of this week, she feels better today actually. She's not had any fevers. She's been sticking to the brat diet. The pain sort of ebbs and wanes, she has not identified any particular triggers or aggravating factors.  Her workup in the ER included a CMP and CBC. She did have a mild leukocytosis to 12.5 with increased neutrophil component, but the remainder of her labs were unremarkable including bilirubin and liver enzymes; her urinalysis did shoe ketonuria. She underwent CT chest/abdomen/pelvis which showed mild distention of the common bile duct to 8 mm and a 1 cm stone at the cystic duct in addition to dependent stones in the gallbladder. They describe a vague soft tissue inflammation around the intrahepatic biliary ducts raising the question for cholangitis.  She has never had any abdominal surgeries. She is a Chemical engineer. She is going on a field trip to [place] on July 9.  The patient is a 46 year old female.   Past Surgical History Juanita Craver, CMA; 08/13/2016 1:39 PM) No pertinent past surgical history  Diagnostic Studies History Juanita Craver, CMA; 08/13/2016 1:39 PM) Colonoscopy  never Mammogram never Pap Smear 1-5 years ago  Allergies Juanita Craver, CMA; 08/13/2016 1:43 PM) Demerol *ANALGESICS - OPIOID* Rash.  Medication History Juanita Craver, CMA; 08/13/2016 1:43 PM) Ondansetron HCl (4MG  Tablet, Oral) Active. Hydrocodone-Acetaminophen (5-325MG  Tablet, Oral) Active. Medications Reconciled  Social History Juanita Craver, CMA; 08/13/2016 1:39 PM) Alcohol use Occasional alcohol use. Caffeine use Carbonated beverages. No drug use Tobacco use Never smoker.  Family History Juanita Craver, CMA; 08/13/2016 1:39 PM) Family history unknown First Degree Relatives  Pregnancy / Birth History Juanita Craver, CMA; 08/13/2016 1:39 PM) Age at menarche 13 years. Contraceptive History Oral contraceptives. Gravida 0 Para 0 Regular periods  Other Problems Juanita Craver, CMA; 08/13/2016 1:39 PM) High blood pressure     Review of Systems (Elica Almas A. Fredricka Bonine MD; 08/13/2016 2:06 PM) General Present- Appetite Loss and Weight Loss. Not Present- Chills, Fatigue, Fever, Night Sweats and Weight Gain. Skin Not Present- Change in Wart/Mole, Dryness, Hives, Jaundice, New Lesions, Non-Healing Wounds, Rash and Ulcer. HEENT Present- Seasonal Allergies. Not Present- Earache, Hearing Loss, Hoarseness, Nose Bleed, Oral Ulcers, Ringing in the Ears, Sinus Pain, Sore Throat, Visual Disturbances, Wears glasses/contact lenses and Yellow Eyes. Respiratory Not Present- Bloody sputum, Chronic Cough, Difficulty Breathing, Snoring and Wheezing. Breast Not Present- Breast Mass, Breast Pain, Nipple Discharge and Skin Changes. Cardiovascular Not Present- Chest Pain, Difficulty Breathing Lying Down, Leg Cramps, Palpitations, Rapid Heart Rate, Shortness of Breath and Swelling of Extremities. Gastrointestinal Present- Abdominal Pain, Bloating and Indigestion. Not Present- Bloody Stool, Change in Bowel Habits, Chronic diarrhea, Constipation, Difficulty Swallowing, Excessive gas, Gets full quickly at  meals, Hemorrhoids, Nausea, Rectal Pain and Vomiting. Female Genitourinary  Present- Painful Urination and Urgency. Not Present- Frequency, Nocturia and Pelvic Pain. Musculoskeletal Not Present- Back Pain, Joint Pain, Joint Stiffness, Muscle Pain, Muscle Weakness and Swelling of Extremities. Neurological Not Present- Decreased Memory, Fainting, Headaches, Numbness, Seizures, Tingling, Tremor, Trouble walking and Weakness. Psychiatric Not Present- Anxiety, Bipolar, Change in Sleep Pattern, Depression, Fearful and Frequent crying. Endocrine Not Present- Cold Intolerance, Excessive Hunger, Hair Changes, Heat Intolerance, Hot flashes and New Diabetes. Hematology Not Present- Blood Thinners, Easy Bruising, Excessive bleeding, Gland problems, HIV and Persistent Infections. All other systems negative  Vitals (Armen Glenn CMA; 08/13/2016 1:41 PM) 08/13/2016 1:40 PM Weight: 213 lb Height: 65in Body Surface Area: 2.03 m Body Mass Index: 35.44 kg/m  Temp.: 98.74F  Pulse: 120 (Regular)  Resp.: 97 (Unlabored)  BP: 116/92 (Sitting, Left Arm, Standard)      Physical Exam (Andraya Frigon A. Fredricka Bonineonnor MD; 08/13/2016 2:10 PM)  General Note: She is alert and well-appearing  Integumentary Note: Skin is warm and dry  Head and Neck Note: No mass or thyromegaly  Eye Note: No scleral icterus. Pupils equally round and reactive  ENMT Note: Several scars around her face. Normal hearing. Oral mucosa is moist and she has normal dentition  Chest and Lung Exam Note: Unlabored respirations, no tactile fremitus  Cardiovascular Note: Regular rate and rhythm. Palpable pedal pulses. No pedal edema  Abdomen Note: Soft, nontender, nondistended. No mass or organomegaly.  Neurologic Note: Grossly intact, normal gait.  Neuropsychiatric Note: Normal judgment and insight. Intact recent and remote memory.  Musculoskeletal Note: Normal gait. No clubbing or cyanosis. Normal range of motion and  muscle tone throughout.    Assessment & Plan (Tahirih Lair A. Fredricka Bonineonnor MD; 08/13/2016 2:12 PM)  BILIARY COLIC (K80.50) Story: We discussed the natural history of gallstones and the possibilities of ongoing pain, cholecystitis, cholangitis or pancreatitis in the symptoms of each of these. I counseled her as to the technical details of laparoscopic cholecystectomy and advised her as to the risks of surgery including bleeding, pain, scarring, infection, intra-abdominal injury specifically to the common bile duct and sequelae, and conversion to open surgery. She is understanding and she had several insightful questions all of which were answered. Given her escalating symptoms I do believe that cholecystectomy should be pursued in the near future. We will try to get her scheduled in the next week or 2

## 2016-08-16 NOTE — Op Note (Signed)
Operative Note  Morgan Bowman 46 y.o. female [id number]  08/16/2016  Surgeon: Berna Buehelsea A Dove Gresham   Assistant: OR staff  Procedure performed: Laparoscopic Cholecystectomy  Preop diagnosis: biliary colic Post-op diagnosis/intraop findings: cholecystitis with empyema of the gallbladder  Specimens: gallbladder  EBL: minimal  Complications: none  Description of procedure: After obtaining informed consent the patient was brought to the operating room. Prophylactic antibiotics and subcutaneous heparin were administered. SCD's were applied. General endotracheal anesthesia was initiated and a formal time-out was performed. The abdomen was prepped and draped in the usual sterile fashion and the abdomen was entered using an infraumbilical veress needle after instilling the site with local. Insufflation to 15mmHg was obtained, 5mm trocar and camera introduced and gross inspection revealed no evidence of injury from our entry or other intraabdominal abnormalities. Two 5mm trocars were introduced in the right midclavicular and right anterior axillary lines under direct visualization and following infiltration with local. An 11mm trocar was placed in the epigastrium. The gallbladder was retracted cephalad and the infundibulum was retracted laterally. The gallbladder was severely acutely inflamed and distended with stones, one of which was impacted at the neck/cystic duct unction. A combination of hook electrocautery and blunt dissection was utilized to clear the peritoneum from the neck and cystic duct, circumferentially isolating the cystic artery and cystic duct and lifting the gallbladder from the cystic plate. The critical view of safety was achieved with the cystic artery, cystic duct, and liver bed visualized between them with no other structures. The common bile duct could also be seen and was somewhat drawn in towards the inflammatory field. The artery was clipped with a single clip proximally and distally  and divided as was the cystic duct with two clips on the proximal end. Gold hemolock clips were used on the cystic duct which was short and somewhat wide.The gallbladder was dissected from the liver plate using electrocautery. This was increased in difficulty due to the somewhat intrahepatic nature of the gallbladder in addition to woody inflammation.  During this dissection a tear in the wall of the body of the gallbladder was sustained and copious purulent fluid was evacuated from the gallbladder. No stones were spilled.  Once freed the gallbladder was placed in an endocatch bag and removed through the epigastric trocar site. A small amount of bleeding on the liver bed was controlled with cautery. The right upper quadrant was aspirated and was irrigated copiously with about 2.5L of saline until the effluent was clear. Hemostasis was once again confirmed, and reinspection of the abdomen revealed no injuries. The clips were well opposed without any bile leak from the duct or the liver bed. The 11mm trocar site in the epigastrium was closed with a 0 vicryl in the fascia under direct visualization using a PMI device. The abdomen was desufflated and all trocars removed. The skin incisions were closed with running subcuticular monocryl and Dermabond. The patient was awakened, extubated and transported to the recovery room in stable condition.   All counts were correct at the completion of the case.

## 2016-08-16 NOTE — Anesthesia Preprocedure Evaluation (Addendum)
Anesthesia Evaluation  Patient identified by MRN, date of birth, ID band Patient awake    Reviewed: Allergy & Precautions, NPO status , Patient's Chart, lab work & pertinent test results  History of Anesthesia Complications (+) PONV  Airway Mallampati: III  TM Distance: >3 FB Neck ROM: Full  Mouth opening: Limited Mouth Opening  Dental  (+) Dental Advisory Given   Pulmonary neg pulmonary ROS,    breath sounds clear to auscultation       Cardiovascular negative cardio ROS   Rhythm:Regular Rate:Normal     Neuro/Psych negative neurological ROS     GI/Hepatic negative GI ROS, Neg liver ROS,   Endo/Other  negative endocrine ROS  Renal/GU negative Renal ROS     Musculoskeletal   Abdominal (+) + obese,   Peds  Hematology negative hematology ROS (+)   Anesthesia Other Findings   Reproductive/Obstetrics                            Anesthesia Physical Anesthesia Plan  ASA: II  Anesthesia Plan: General   Post-op Pain Management:    Induction: Intravenous  PONV Risk Score and Plan: 4 or greater and Ondansetron, Dexamethasone, Propofol, Midazolam and Scopolamine patch - Pre-op  Airway Management Planned: Oral ETT  Additional Equipment:   Intra-op Plan:   Post-operative Plan: Extubation in OR  Informed Consent: I have reviewed the patients History and Physical, chart, labs and discussed the procedure including the risks, benefits and alternatives for the proposed anesthesia with the patient or authorized representative who has indicated his/her understanding and acceptance.   Dental advisory given  Plan Discussed with: CRNA  Anesthesia Plan Comments:        Anesthesia Quick Evaluation

## 2016-08-16 NOTE — Transfer of Care (Signed)
  Last Vitals:  Vitals:   08/16/16 0944 08/16/16 1249  BP: (!) 144/87   Pulse: 96   Resp: 17 (!) 22  Temp: 36.8 C 36.9 C    Last Pain:  Vitals:   08/16/16 0944  TempSrc: Oral      Patients Stated Pain Goal: 5 (08/16/16 0944)  Immediate Anesthesia Transfer of Care Note  Patient: Morgan Bowman  Procedure(s) Performed: Procedure(s) (LRB): LAPAROSCOPIC CHOLECYSTECTOMY (N/A)  Patient Location: PACU  Anesthesia Type: General  Level of Consciousness: awake, alert  and oriented  Airway & Oxygen Therapy: Patient Spontanous Breathing and Patient connected to nasal cannula oxygen  Post-op Assessment: Report given to PACU RN and Post -op Vital signs reviewed and stable  Post vital signs: Reviewed and stable  Complications: No apparent anesthesia complications

## 2016-08-16 NOTE — Discharge Instructions (Signed)
LAPAROSCOPIC SURGERY: POST OP INSTRUCTIONS  ######################################################################  EAT Gradually transition to a high fiber diet with a fiber supplement over the next few weeks after discharge.  Start with a pureed / full liquid diet (see below)  WALK Walk an hour a day.  Control your pain to do that.    CONTROL PAIN Control pain so that you can walk, sleep, tolerate sneezing/coughing, go up/down stairs.  HAVE A BOWEL MOVEMENT DAILY Keep your bowels regular to avoid problems.  OK to try a laxative to override constipation.  OK to use an antidairrheal to slow down diarrhea.  Call if not better after 2 tries  CALL IF YOU HAVE PROBLEMS/CONCERNS Call if you are still struggling despite following these instructions. Call if you have concerns not answered by these instructions  ######################################################################    1. DIET: Follow a light bland diet the first 24 hours after arrival home, such as soup, liquids, crackers, etc.  Be sure to include lots of fluids daily.  Avoid fast food or heavy meals as your are more likely to get nauseated.  Eat a low fat the next few days after surgery.   2. Take your usually prescribed home medications unless otherwise directed. 3. PAIN CONTROL: a. Pain is best controlled by a usual combination of three different methods TOGETHER: i. Ice/Heat ii. Over the counter pain medication iii. Prescription pain medication b. Most patients will experience some swelling and bruising around the incisions.  Ice packs or heating pads (30-60 minutes up to 6 times a day) will help. Use ice for the first few days to help decrease swelling and bruising, then switch to heat to help relax tight/sore spots and speed recovery.  Some people prefer to use ice alone, heat alone, alternating between ice & heat.  Experiment to what works for you.  Swelling and bruising can take several weeks to resolve.   c. It is  helpful to take an over-the-counter pain medication regularly for the first few weeks.  Choose one of the following that works best for you: i. Naproxen (Aleve, etc)  Two 251m tabs twice a day ii. Ibuprofen (Advil, etc) Three 2053mtabs four times a day (every meal & bedtime) iii. Acetaminophen (Tylenol, etc) 500-65044mour times a day (every meal & bedtime) d. A  prescription for pain medication (such as oxycodone, hydrocodone, etc) should be given to you upon discharge.  Take your pain medication as prescribed.  i. If you are having problems/concerns with the prescription medicine (does not control pain, nausea, vomiting, rash, itching, etc), please call us [phone] see if we need to switch you to a different pain medicine that will work better for you and/or control your side effect better. ii. If you need a refill on your pain medication, please contact your pharmacy.  They will contact our office to request authorization. Prescriptions will not be filled after 5 pm or on week-ends. 4. Avoid getting constipated.  Between the surgery and the pain medications, it is common to experience some constipation.  Increasing fluid intake and taking a fiber supplement (such as Metamucil, Citrucel, FiberCon, MiraLax, etc) 1-2 times a day regularly will usually help prevent this problem from occurring.  A mild laxative (prune juice, Milk of Magnesia, MiraLax, etc) should be taken according to package directions if there are no bowel movements after 48 hours.   5. Watch out for diarrhea.  If you have many loose bowel movements, simplify your diet to bland foods & liquids for  a few days.  Stop any stool softeners and decrease your fiber supplement.  Switching to mild anti-diarrheal medications (Kayopectate, Pepto Bismol) can help.  If this worsens or does not improve, please call us. 6. Wash / shower every day.  You may shower over the skin glue which is waterproof.  Continue to shower over incision(s) after  the dressing is off. 7. The skin glue will flake off after about 2 weeks.  You may leave the incision open to air.  You may replace a dressing/Band-Aid to cover the incision for comfort if you wish.  8. ACTIVITIES as tolerated:   a. You may resume regular (light) daily activities beginning the next day--such as daily self-care, walking, climbing stairs--gradually increasing activities as tolerated.  If you can walk 30 minutes without difficulty, it is safe to try more intense activity such as jogging, treadmill, bicycling, low-impact aerobics, swimming, etc. b. Save the most intensive and strenuous activity for last such as sit-ups, heavy lifting, contact sports, etc  Refrain from any heavy lifting or straining until you are off narcotics for pain control.   c. DO NOT PUSH THROUGH PAIN.  Let pain be your guide: If it hurts to do something, don't do it.  Pain is your body warning you to avoid that activity for another week until the pain goes down. d. You may drive when you are no longer taking prescription pain medication, you can comfortably wear a seatbelt, and you can safely maneuver your car and apply brakes. e. Bonita QuinYou may have sexual intercourse when it is comfortable.  9. FOLLOW UP in our office a. Please call [facility] at [phone] to set up an appointment to see your surgeon in the office for a follow-up appointment approximately 2-3 weeks after your surgery. b. Make sure that you call for this appointment the day you arrive home to insure a convenient appointment time. 10. IF YOU HAVE DISABILITY OR FAMILY LEAVE FORMS, BRING THEM TO THE OFFICE FOR PROCESSING.  DO NOT GIVE THEM TO YOUR DOCTOR.   WHEN TO CALL US [phone]) [phone]: 1. Poor pain control 2. Reactions / problems with new medications (rash/itching, nausea, etc)  3. Fever over 101.5 F (38.5 C) 4. Inability to urinate 5. Nausea and/or vomiting 6. Worsening swelling or bruising 7. Continued bleeding from incision. 8. Increased pain,  redness, or drainage from the incision   The clinic staff is available to answer your questions during regular business hours (8:30am-5pm).  Please dont hesitate to call and ask to speak to one of our nurses for clinical concerns.   If you have a medical emergency, go to the nearest emergency room or call 911.  A surgeon from [facility] call at the [facility]   [facility], [place] [address], [address], [place], [place]  [id number] ? MAIN: [phone]) [phone] ? TOLL FREE: [phone] ?  FAX [phone] [email or web address]   Post Anesthesia Home Care Instructions  Activity: Get plenty of rest for the remainder of the day. A responsible individual must stay with you for 24 hours following the procedure.  For the next 24 hours, DO NOT: -Drive a car -Advertising copywriterperate machinery -Drink alcoholic beverages -Take any medication unless instructed by your physician -Make any legal decisions or sign important papers.  Meals: Start with liquid foods such as gelatin or soup. Progress to regular foods as tolerated. Avoid greasy, spicy, heavy foods. If nausea and/or vomiting occur, drink only clear liquids until the nausea  and/or vomiting subsides. Call your physician if vomiting continues.  Special Instructions/Symptoms: Your throat may feel dry or sore from the anesthesia or the breathing tube placed in your throat during surgery. If this causes discomfort, gargle with warm salt water. The discomfort should disappear within 24 hours.  If you had a scopolamine patch placed behind your ear for the management of post- operative nausea and/or vomiting:  1. The medication in the patch is effective for 72 hours, after which it should be removed.  Wrap patch in a tissue and discard in the trash. Wash hands thoroughly with soap and water. 2. You may remove the patch earlier than 72 hours if you experience unpleasant side effects which may include dry mouth,  dizziness or visual disturbances. 3. Avoid touching the patch. Wash your hands with soap and water after contact with the patch.

## 2016-08-17 ENCOUNTER — Encounter (HOSPITAL_BASED_OUTPATIENT_CLINIC_OR_DEPARTMENT_OTHER): Payer: Self-pay | Admitting: Surgery

## 2018-07-15 IMAGING — CT CT ABD-PELV W/ CM
2 of 4 series · 14 of 36 positions shown, 17 images · IV contrast (iopamidol)
Comparison: None.

CLINICAL DATA: Acute onset of epigastric abdominal pain, nausea,
vomiting and diarrhea. Initial encounter.

EXAM:
CT CHEST, ABDOMEN, AND PELVIS WITH CONTRAST
TECHNIQUE: Multidetector CT imaging of the chest, abdomen and pelvis was
performed following the standard protocol during bolus
administration of intravenous contrast.
CONTRAST:  150mL [id number] IOPAMIDOL (9KXXB3-3DD) INJECTION 61%

[Series 2: axial st · axial · 0.69mm/px · z∈[-303,-35]mm · 11 of 156 slices shown, 14 images]
[im 11/156  mediastinal]
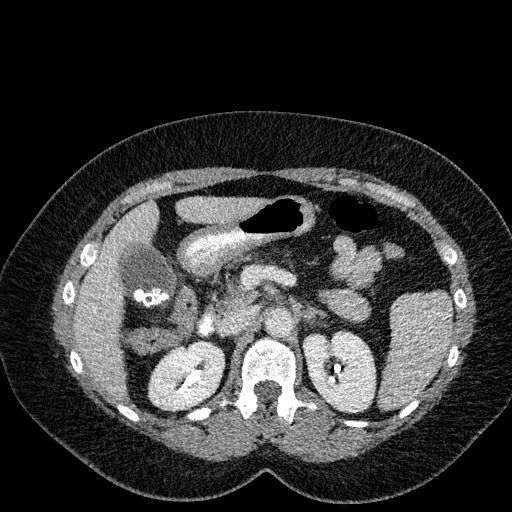
[im 11/156  lung]
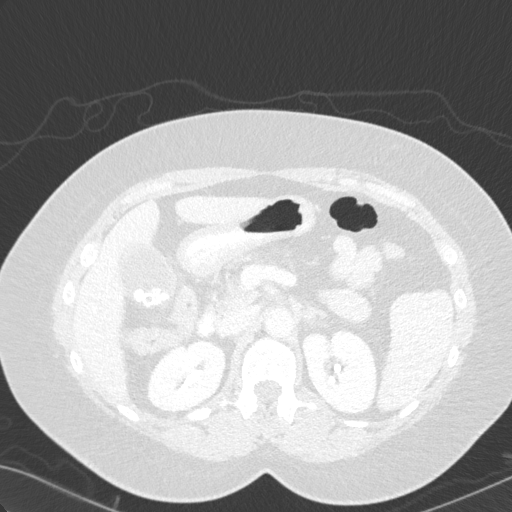
[im 21/156  lung]
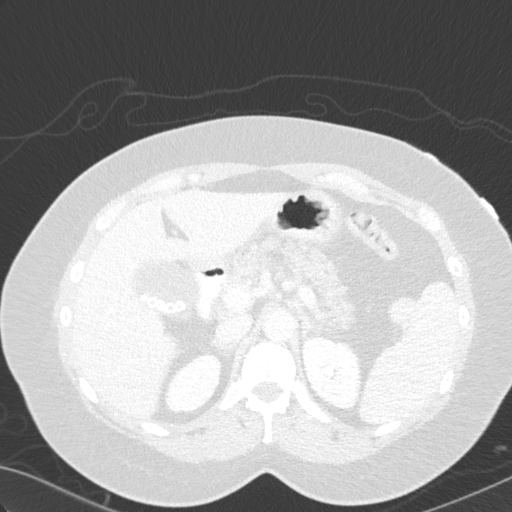
[im 42/156  lung]
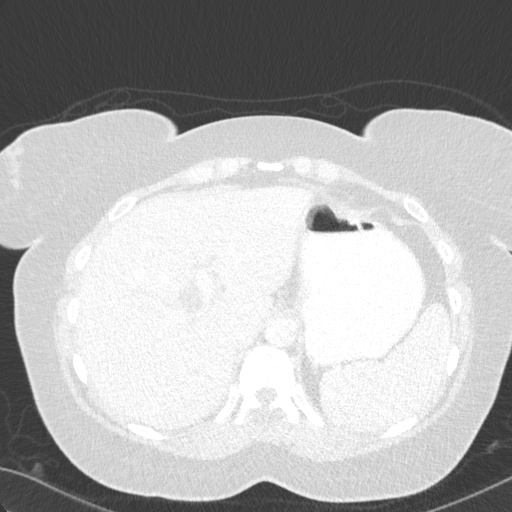
[im 52/156  lung]
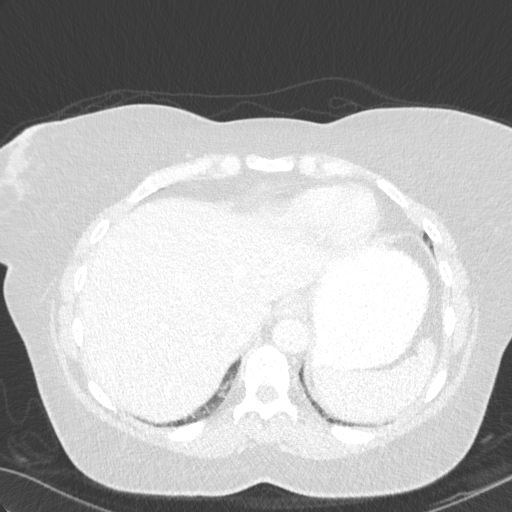
[im 63/156  mediastinal]
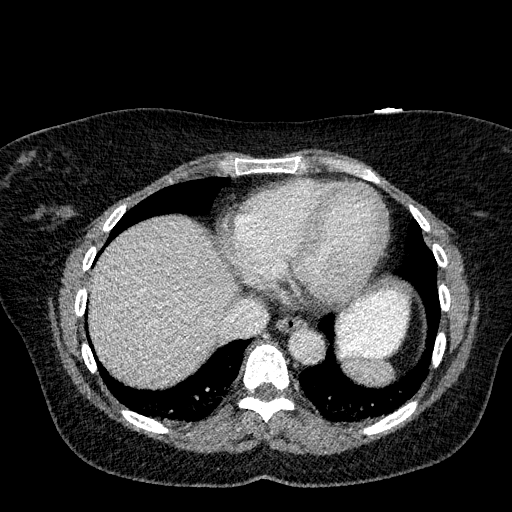
[im 63/156  lung]
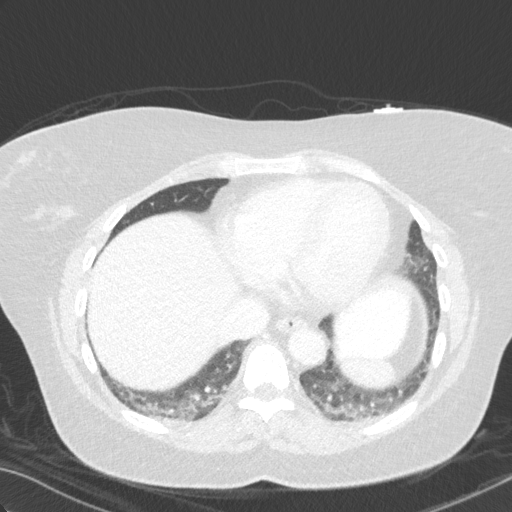
[im 83/156  lung]
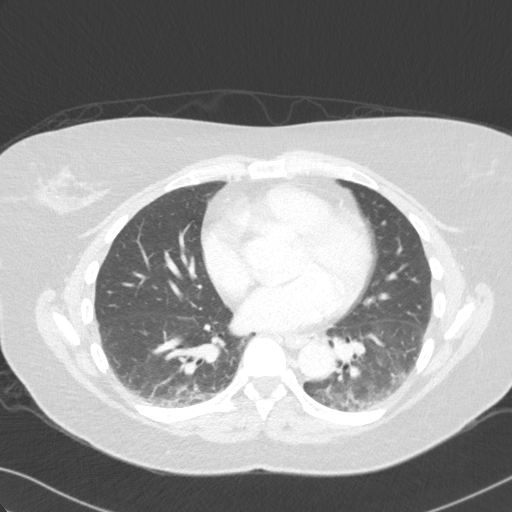
[im 94/156  lung]
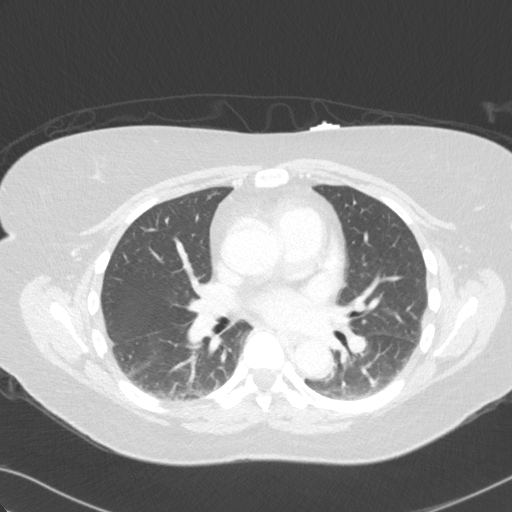
[im 104/156  lung]
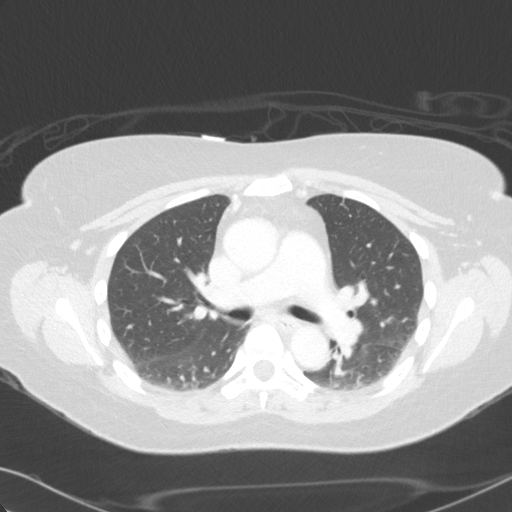
[im 114/156  mediastinal]
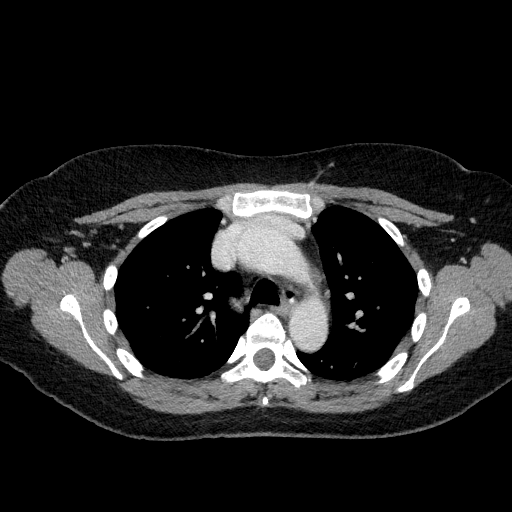
[im 114/156  lung]
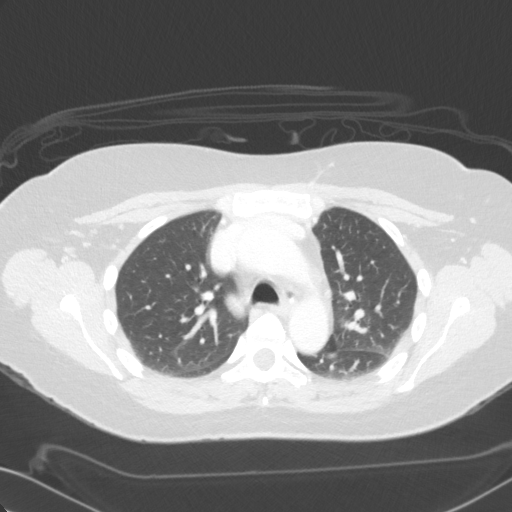
[im 135/156  lung]
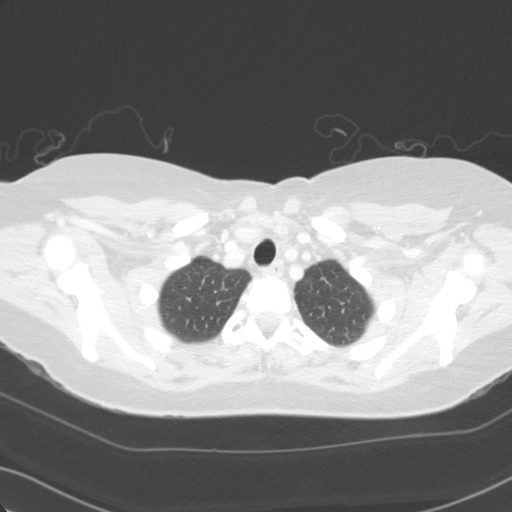
[im 145/156  lung]
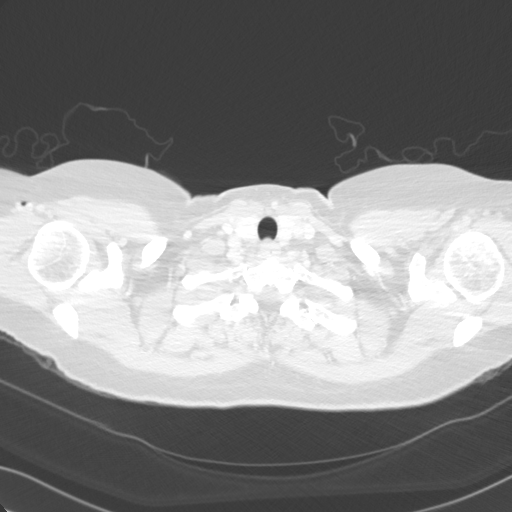

[Series 5: coronal · coronal · 0.61mm/px · 3 of 108 slices shown]
[im 22/108  lung]
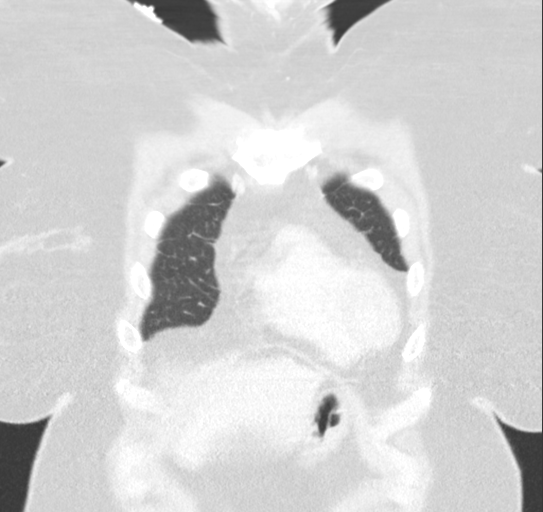
[im 43/108  lung]
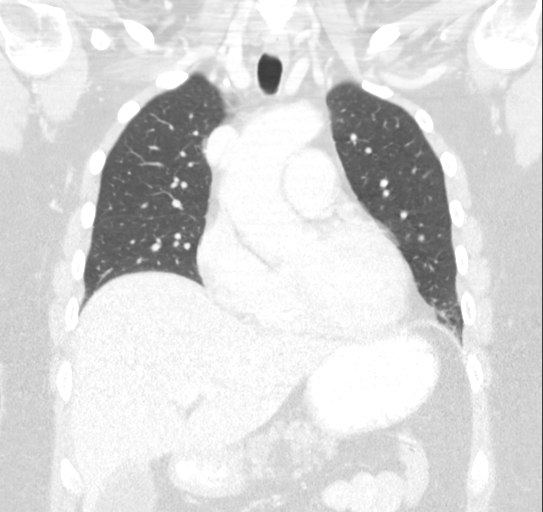
[im 65/108  lung]
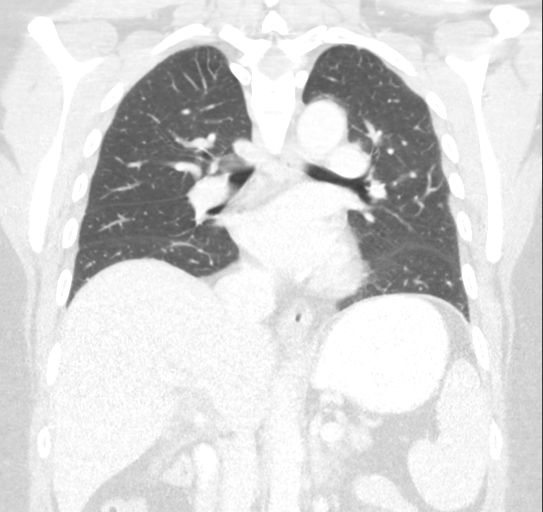

[14 of 36 positions shown; findings below may reference images not displayed]

FINDINGS: CT CHEST FINDINGS

Cardiovascular: The heart is normal in size. The thoracic aorta is
unremarkable in appearance. No calcific atherosclerotic disease is
seen. There is no evidence of aortic injury. There is no evidence of
venous hemorrhage. The great vessels are grossly unremarkable.

Mediastinum/Nodes: The mediastinum is unremarkable in appearance. No
mediastinal lymphadenopathy is seen. No pericardial effusion is
identified. The thyroid gland is unremarkable. No axillary
lymphadenopathy is appreciated.

Lungs/Pleura: Minimal bibasilar atelectasis is noted. The lungs are
otherwise clear. No pleural effusion or pneumothorax is seen. No
masses are identified. There is no evidence of pulmonary parenchymal
contusion.

Musculoskeletal: No acute osseous abnormalities are identified. The
visualized musculature is unremarkable in appearance.

CT ABDOMEN PELVIS FINDINGS

Hepatobiliary: The liver is unremarkable in appearance.

There is mild distention of the common bile duct to 8 mm. An
apparent 1.0 cm stone is seen at the cystic duct. Stones are noted
dependently within the gallbladder. There is vague soft tissue
inflammation about the intrahepatic biliary ducts, raising question
for cholangitis.

Pancreas: The pancreas is within normal limits.

Spleen: The spleen is unremarkable in appearance.

Adrenals/Urinary Tract: The adrenal glands are unremarkable in
appearance. The kidneys are within normal limits. There is no
evidence of hydronephrosis. No renal or ureteral stones are
identified. No perinephric stranding is seen.

Stomach/Bowel: The stomach is unremarkable in appearance. The small
bowel is within normal limits. The appendix is normal in caliber,
without evidence of appendicitis. The colon is unremarkable in
appearance.

Vascular/Lymphatic: The abdominal aorta is unremarkable in
appearance. The inferior vena cava is grossly unremarkable. No
retroperitoneal lymphadenopathy is seen. No pelvic sidewall
lymphadenopathy is identified.

Reproductive: The bladder is mildly distended and within normal
limits. The uterus is grossly unremarkable in appearance. The
ovaries are relatively symmetric. No suspicious adnexal masses are
seen.

Other: No additional soft tissue abnormalities are seen.

Musculoskeletal: No acute osseous abnormalities are identified. The
visualized musculature is unremarkable in appearance.
IMPRESSION: 1. Mild distention of the common bile duct to 8 mm. Apparent 1.0 cm
stone noted at the cystic duct, raising concern for intermittent
obstruction. Cholelithiasis noted. Vague soft tissue inflammation
about the intrahepatic biliary ducts raises concern for cholangitis.
Would correlate with LFTs. Given distention of the common bile duct,
MRCP or ERCP could be considered for further evaluation.
2. Minimal bibasilar atelectasis noted.  Lungs otherwise clear.

## 2019-01-08 ENCOUNTER — Other Ambulatory Visit: Payer: Self-pay

## 2019-01-08 DIAGNOSIS — Z20822 Contact with and (suspected) exposure to covid-19: Secondary | ICD-10-CM

## 2019-01-10 LAB — NOVEL CORONAVIRUS, NAA: SARS-CoV-2, NAA: NOT DETECTED

## 2019-04-19 ENCOUNTER — Ambulatory Visit: Payer: BC Managed Care – PPO | Attending: Internal Medicine

## 2019-04-19 DIAGNOSIS — Z23 Encounter for immunization: Secondary | ICD-10-CM | POA: Insufficient documentation

## 2019-04-19 NOTE — Progress Notes (Signed)
   Covid-19 Vaccination Clinic  Name:  Morgan Bowman    MRN: [id number] DOB: Jun 03, 1970  04/19/2019  [name] was observed post Covid-19 immunization for 15 minutes without incidence. She was provided with Vaccine Information Sheet and instruction to access the V-Safe system.   [name] was instructed to call 911 with any severe reactions post vaccine: Marland Kitchen Difficulty breathing  . Swelling of your face and throat  . A fast heartbeat  . A bad rash all over your body  . Dizziness and weakness    Immunizations Administered    Name Date Dose VIS Date Route   Pfizer COVID-19 Vaccine 04/19/2019 10:29 AM 0.3 mL 02/02/2019 Intramuscular   Manufacturer: [employer], [employer]   Lot: [id number]   NDC: [address]-8

## 2019-05-09 ENCOUNTER — Ambulatory Visit: Payer: BC Managed Care – PPO | Attending: Internal Medicine

## 2019-05-09 DIAGNOSIS — Z23 Encounter for immunization: Secondary | ICD-10-CM

## 2019-05-09 NOTE — Progress Notes (Signed)
   Covid-19 Vaccination Clinic  Name:  Morgan Bowman    MRN: [id number] DOB: January 03, 1971  05/09/2019  Morgan Bowman was observed post Covid-19 immunization for 15 minutes without incident. She was provided with Vaccine Information Sheet and instruction to access the V-Safe system.   Morgan Bowman was instructed to call 911 with any severe reactions post vaccine: Marland Kitchen Difficulty breathing  . Swelling of face and throat  . A fast heartbeat  . A bad rash all over body  . Dizziness and weakness   Immunizations Administered    Name Date Dose VIS Date Route   Pfizer COVID-19 Vaccine 05/09/2019  3:32 PM 0.3 mL 02/02/2019 Intramuscular   Manufacturer: [employer], [employer]   Lot: [id number]   NDC: [address]-9

## 2019-12-08 ENCOUNTER — Other Ambulatory Visit: Payer: Self-pay

## 2019-12-08 ENCOUNTER — Ambulatory Visit: Payer: BC Managed Care – PPO | Attending: Internal Medicine

## 2019-12-08 DIAGNOSIS — Z23 Encounter for immunization: Secondary | ICD-10-CM

## 2019-12-08 NOTE — Progress Notes (Signed)
   Covid-19 Vaccination Clinic  Name:  Morgan Bowman    MRN: [id number] DOB: Sep 07, 1970  12/08/2019  [name] was observed post Covid-19 immunization for 15 minutes without incident. She was provided with Vaccine Information Sheet and instruction to access the V-Safe system.   [name] was instructed to call 911 with any severe reactions post vaccine: Marland Kitchen Difficulty breathing  . Swelling of face and throat  . A fast heartbeat  . A bad rash all over body  . Dizziness and weakness

## 2021-06-24 ENCOUNTER — Encounter: Payer: Self-pay | Admitting: Family Medicine

## 2021-06-24 ENCOUNTER — Ambulatory Visit: Payer: BC Managed Care – PPO | Admitting: Family Medicine

## 2021-06-24 VITALS — BP 127/90 | Resp 20 | Ht 64.0 in | Wt 212.2 lb

## 2021-06-24 DIAGNOSIS — Z1211 Encounter for screening for malignant neoplasm of colon: Secondary | ICD-10-CM | POA: Diagnosis not present

## 2021-06-24 DIAGNOSIS — F5101 Primary insomnia: Secondary | ICD-10-CM

## 2021-06-24 DIAGNOSIS — D649 Anemia, unspecified: Secondary | ICD-10-CM

## 2021-06-24 DIAGNOSIS — Z1239 Encounter for other screening for malignant neoplasm of breast: Secondary | ICD-10-CM | POA: Diagnosis not present

## 2021-06-24 DIAGNOSIS — E669 Obesity, unspecified: Secondary | ICD-10-CM

## 2021-06-24 DIAGNOSIS — E611 Iron deficiency: Secondary | ICD-10-CM

## 2021-06-24 MED ORDER — RAMELTEON 8 MG PO TABS: 8.0000 mg | ORAL_TABLET | Freq: Every day | ORAL | 3 refills | Status: DC

## 2021-06-24 NOTE — Patient Instructions (Signed)
Thank you for choosing [facility] for your Primary Care needs. I am excited for the opportunity to partner with you to meet your health care goals. It was a pleasure meeting you today! ? ? ? ?Information on diet, exercise, and health maintenance recommendations are listed below. This is information to help you be sure you are on track for optimal health and monitoring.  ? ?Please look over this and let us know if you have any questions or if you have completed any of the health maintenance outside of [facility] so that we can be sure your records are up to date.  ?___________________________________________________________ ? ?MyChart:  ?For all urgent or time sensitive needs we ask that you please call the office to avoid delays. Our number is [phone]. ?MyChart is not constantly monitored and due to the large volume of messages a day, replies may take up to 72 business hours. ? ?MyChart Policy: ?MyChart allows for you to see your visit notes, after visit summary, provider recommendations, lab and tests results, make an appointment, request refills, and contact your provider or the office for non-urgent questions or concerns. Providers are seeing patients during normal business hours and do not have built in time to review MyChart messages.  ?We ask that you allow a minimum of 3 business days for responses to MyChart messages. For this reason, please do not send urgent requests through MyChart. Please call the office at [phone]. ?New and ongoing conditions may require a visit. We have virtual and in-person visits available for your convenience.  ?Complex MyChart concerns may require a visit. Your provider may request you schedule a virtual or in-person visit to ensure we are providing the best care possible. ?MyChart messages sent after 11:00 AM on Friday will not be received by the provider until Monday morning.  ?  ?Lab and Test Results: ?You will receive your lab and  test results on MyChart as soon as they are completed and results have been sent by the lab or testing facility. Due to this service, you will receive your results BEFORE your provider.  ?I review lab and test results each morning prior to seeing patients. Some results require collaboration with other providers to ensure you are receiving the most appropriate care. For this reason, we ask that you please allow a minimum of 3-5 business days from the time that ALL results have been received for your provider to receive and review lab and test results and contact you about these.  ?Most lab and test result comments from the provider will be sent through MyChart. Your provider may recommend changes to the plan of care, follow-up visits, repeat testing, ask questions, or request an office visit to discuss these results. You may reply directly to this message or call the office to provide information for the provider or set up an appointment. ?In some instances, you will be called with test results and recommendations. Please let us know if this is preferred and we will make note of this in your chart to provide this for you.    ?If you have not heard a response to your lab or test results in 5 business days from all results returning to MyChart, please call the office to let us know. We ask that you please avoid calling prior to this time unless there is an emergent concern. Due to high call volumes, this can delay the resulting process. ? ?After Hours: ?For all non-emergency after hours   needs, please call the office at [phone] and select the option to reach the on-call  service. On-call services are shared between multiple [facility] offices and therefore it will not be possible to speak directly with your provider. On-call providers may provide medical advice and recommendations, but are unable to provide refills for maintenance medications.  ?For all emergency or urgent medical needs after normal business  hours, we recommend that you seek care at the closest Urgent Care or Emergency Department to ensure appropriate treatment in a timely manner.  ?[facility] has a 24 hour emergency room located on the ground floor for your convenience.  ? ?Urgent Concerns During the Business Day ?Providers are seeing patients from 8AM to 5PM with a busy schedule and are most often not able to respond to non-urgent calls until the end of the day or the next business day. ?If you should have URGENT concerns during the day, please call and speak to the nurse or schedule a same day appointment so that we can address your concern without delay.  ? ?Thank you, again, for choosing me as your health care partner. I appreciate your trust and look forward to learning more about you.  ? ?Sultana Tierney B. Makyiah Lie, DNP, FNP-C ? ?___________________________________________________________ ? ?Health Maintenance Recommendations ?Screening Testing ?Mammogram ?Every 1-2 years based on history and risk factors ?Starting at age 50 ?Pap Smear ?Ages 21-39 every 3 years ?Ages 30-65 every 5 years with HPV testing ?More frequent testing may be required based on results and history ?Colon Cancer Screening ?Every 1-10 years based on test performed, risk factors, and history ?Starting at age 45 ?Bone Density Screening ?Every 2-10 years based on history ?Starting at age 65 for women ?Recommendations for men differ based on medication usage, history, and risk factors ?AAA Screening ?One time ultrasound ?Men 65-75 years old who have ever smoked ?Lung Cancer Screening ?Low Dose Lung CT every 12 months ?Age 50-80 years with a 20 pack-year smoking history who still smoke or who have quit within the last 15 years ? ?Screening Labs ?Routine  Labs: Complete Blood Count (CBC), Complete Metabolic Panel (CMP), Cholesterol (Lipid Panel) ?Every 6-12 months based on history and medications ?May be recommended more frequently based on current conditions or  previous results ?Hemoglobin A1c Lab ?Every 3-12 months based on history and previous results ?Starting at age 45 or earlier with diagnosis of diabetes, high cholesterol, BMI >26, and/or risk factors ?Frequent monitoring for patients with diabetes to ensure blood sugar control ?Thyroid Panel (TSH w/ T3 & T4) ?Every 6 months based on history, symptoms, and risk factors ?May be repeated more often if on medication ?HIV ?One time testing for all patients 13 and older ?May be repeated more frequently for patients with increased risk factors or exposure ?Hepatitis C ?One time testing for all patients 18 and older ?May be repeated more frequently for patients with increased risk factors or exposure ?Gonorrhea, Chlamydia ?Every 12 months for all sexually active persons 13-24 years ?Additional monitoring may be recommended for those who are considered high risk or who have symptoms ?PSA ?Men 40-54 years old with risk factors ?Additional screening may be recommended from age 55-69 based on risk factors, symptoms, and history ? ?Vaccine Recommendations ?Tetanus Booster ?All adults every 10 years ?Flu Vaccine ?All patients 6 months and older every year ?COVID Vaccine ?All patients 12 years and older ?Initial dosing with booster ?May recommend additional booster based on age and health history ?HPV Vaccine ?2 doses all   patients age 9-26 ?Dosing may be considered for patients over 26 ?Shingles Vaccine (Shingrix) ?2 doses all adults 50 years and older ?Pneumonia (Pneumovax 23) ?All adults 65 years and older ?May recommend earlier dosing based on health history ?Pneumonia (Prevnar 13) ?All adults 65 years and older ?Dosed 1 year after Pneumovax 23 ?Pneumonia (Prevnar 20) ?All adults 65 years and older (adults 19-64 with certain conditions or risk factors) ?1 dose  ?For those who have no received Prevnar 13 vaccine previously ? ? ?Additional Screening, Testing, and Vaccinations may be recommended on an individualized basis based on  family history, health history, risk factors, and/or exposure.  ?__________________________________________________________ ? ?Diet Recommendations for All Patients ? ?I recommend that all patients maintain a di

## 2021-06-24 NOTE — Assessment & Plan Note (Addendum)
Asymptomatic. Will repeat CBC and add iron panel today.  ?

## 2021-06-24 NOTE — Assessment & Plan Note (Signed)
She has been working hard with lifestyle changes and practicing good sleep hygiene. No improvement with melatonin or OTC sleeping aids. No signs/symptoms of sleep apnea. She is interested in sleeping medication to assist. Will try starting with ramelteon to see if she gets any improvement. Medication sheet provided and sleep habits reinforced.  ?
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
## 2021-06-24 NOTE — Assessment & Plan Note (Signed)
No concerns. Doing well and making progress - down about 20 pounds since starting with weight management program. She denies any concerns. Encouragement provided - continue with lifestyle changes.  ?

## 2021-06-25 ENCOUNTER — Encounter: Payer: Self-pay | Admitting: Family Medicine

## 2021-06-25 LAB — CBC
HCT: 33.4 % — ABNORMAL LOW (ref 36.0–46.0)
Hemoglobin: 10.5 g/dL — ABNORMAL LOW (ref 12.0–15.0)
MCHC: 31.3 g/dL (ref 30.0–36.0)
MCV: 79.7 fl (ref 78.0–100.0)
Platelets: 259 10*3/uL (ref 150.0–400.0)
RBC: 4.2 Mil/uL (ref 3.87–5.11)
RDW: 16.8 % — ABNORMAL HIGH (ref 11.5–15.5)
WBC: 5.7 10*3/uL (ref 4.0–10.5)

## 2021-06-25 LAB — IBC PANEL
Iron: 21 ug/dL — ABNORMAL LOW (ref 42–145)
Saturation Ratios: 5.6 % — ABNORMAL LOW (ref 20.0–50.0)
TIBC: 375.2 ug/dL (ref 250.0–450.0)
Transferrin: 268 mg/dL (ref 212.0–360.0)

## 2021-06-25 NOTE — Addendum Note (Signed)
Addended by: Caleen Jobs B on: 06/25/2021 05:09 PM ? ? Modules accepted: Orders ? ?

## 2021-07-07 ENCOUNTER — Ambulatory Visit (HOSPITAL_BASED_OUTPATIENT_CLINIC_OR_DEPARTMENT_OTHER): Payer: BC Managed Care – PPO

## 2021-07-14 ENCOUNTER — Ambulatory Visit (HOSPITAL_BASED_OUTPATIENT_CLINIC_OR_DEPARTMENT_OTHER)
Admission: RE | Admit: 2021-07-14 | Discharge: 2021-07-14 | Disposition: A | Discharge status: Home / Self Care | Payer: BC Managed Care – PPO | Source: Ambulatory Visit | Attending: Family Medicine | Admitting: Family Medicine

## 2021-07-14 ENCOUNTER — Encounter (HOSPITAL_BASED_OUTPATIENT_CLINIC_OR_DEPARTMENT_OTHER): Payer: Self-pay

## 2021-07-14 DIAGNOSIS — Z1239 Encounter for other screening for malignant neoplasm of breast: Secondary | ICD-10-CM

## 2021-07-14 DIAGNOSIS — Z1231 Encounter for screening mammogram for malignant neoplasm of breast: Secondary | ICD-10-CM | POA: Insufficient documentation

## 2021-07-16 ENCOUNTER — Other Ambulatory Visit: Payer: Self-pay | Admitting: Family Medicine

## 2021-07-16 DIAGNOSIS — R928 Other abnormal and inconclusive findings on diagnostic imaging of breast: Secondary | ICD-10-CM

## 2021-07-23 ENCOUNTER — Encounter: Payer: Self-pay | Admitting: *Deleted

## 2021-07-24 ENCOUNTER — Other Ambulatory Visit: Payer: Self-pay | Admitting: *Deleted

## 2021-07-24 DIAGNOSIS — Z1211 Encounter for screening for malignant neoplasm of colon: Secondary | ICD-10-CM

## 2021-07-24 NOTE — Progress Notes (Signed)
GI referral placed for colonoscopy. 

## 2021-07-25 ENCOUNTER — Other Ambulatory Visit: Payer: Self-pay | Admitting: Family Medicine

## 2021-07-25 DIAGNOSIS — F5101 Primary insomnia: Secondary | ICD-10-CM

## 2021-07-27 ENCOUNTER — Telehealth: Payer: Self-pay | Admitting: *Deleted

## 2021-07-27 ENCOUNTER — Ambulatory Visit
Admission: RE | Admit: 2021-07-27 | Discharge: 2021-07-27 | Disposition: A | Discharge status: Home / Self Care | Payer: BC Managed Care – PPO | Source: Ambulatory Visit | Attending: Family Medicine | Admitting: Family Medicine

## 2021-07-27 DIAGNOSIS — R928 Other abnormal and inconclusive findings on diagnostic imaging of breast: Secondary | ICD-10-CM

## 2021-07-27 NOTE — Telephone Encounter (Signed)
Prior auth started via cover my meds.  Awaiting determination.  Key: [id number]

## 2021-07-28 ENCOUNTER — Other Ambulatory Visit: Payer: Self-pay | Admitting: Family Medicine

## 2021-07-28 ENCOUNTER — Encounter: Payer: Self-pay | Admitting: Family Medicine

## 2021-07-28 DIAGNOSIS — F5101 Primary insomnia: Secondary | ICD-10-CM

## 2021-07-28 NOTE — Telephone Encounter (Signed)
As long as you remain covered by the [facility] and there are no changes to your plan benefits, this request is approved for the following time period: 07/27/2021 - 07/27/2024

## 2021-08-16 ENCOUNTER — Ambulatory Visit: Payer: BC Managed Care – PPO

## 2021-10-13 ENCOUNTER — Encounter: Payer: Self-pay | Admitting: Family Medicine

## 2021-10-13 ENCOUNTER — Other Ambulatory Visit: Payer: Self-pay | Admitting: Family

## 2021-10-13 DIAGNOSIS — F5101 Primary insomnia: Secondary | ICD-10-CM

## 2021-10-13 MED ORDER — RAMELTEON 8 MG PO TABS: 8.0000 mg | ORAL_TABLET | Freq: Every day | ORAL | 3 refills | Status: AC

## 2022-09-14 ENCOUNTER — Ambulatory Visit: Payer: BC Managed Care – PPO | Admitting: Family Medicine

## 2022-09-14 VITALS — BP 135/87 | HR 94 | Temp 97.7°F | Ht 64.0 in | Wt 178.0 lb

## 2022-09-14 DIAGNOSIS — R051 Acute cough: Secondary | ICD-10-CM | POA: Diagnosis not present

## 2022-09-14 MED ORDER — AMOXICILLIN-POT CLAVULANATE 875-125 MG PO TABS: 1.0000 | ORAL_TABLET | Freq: Two times a day (BID) | ORAL | 0 refills | Status: AC

## 2022-09-14 MED ORDER — BENZONATATE 200 MG PO CAPS: 200.0000 mg | ORAL_CAPSULE | Freq: Two times a day (BID) | ORAL | 0 refills | Status: AC | PRN

## 2022-09-14 NOTE — Progress Notes (Signed)
Acute Office Visit  Subjective:     Patient ID: Morgan Bowman, female    DOB: [birth year], 52 y.o.   MRN: [id number]  Chief Complaint  Patient presents with   Cough    Cough   Patient is in today for ongoing cough and drainage.   Discussed the use of AI scribe software for clinical note transcription with the patient, who gave verbal consent to proceed.  History of Present Illness   The patient, with a history of allergies, presents with a persistent cough and phlegm production for over a month. The cough originated with a sensation of throat irritation, without any chest discomfort. The patient experienced voice loss for a period and had a few days of malaise, but otherwise felt well. Despite a trip to [place], the cough persisted, with a nurse on the trip noting the frequency of the cough. Upon return, the patient felt increasingly fatigued, attributing it initially to jet lag. They took a COVID test, which was negative. The patient has been using Sudafed for symptom relief.  The cough is associated with phlegm production, described as yellow and thick. The patient denies fever, chest pain, or wheezing. They report some shortness of breath with exercise, but not with normal activities. The patient also reports postnasal drainage, which they describe as choking them, and believe this is the cause of their cough. They deny any significant headaches, ear pain, or sinus pressure. The patient has been taking Zyrtec for allergies.  The patient's blood pressure was initially high but normalized on recheck. The patient denies any recent antibiotic use or allergies to antibiotics.              Review of Systems  Respiratory:  Positive for cough.    All review of systems negative except what is listed in the HPI      Objective:    BP 135/87   Pulse 94   Temp 97.7 F (36.5 C) (Oral)   Ht 5\' 4"  (1.626 m)   Wt 178 lb (80.7 kg)   SpO2 100%   BMI 30.55 kg/m    Physical  Exam Vitals reviewed.  Constitutional:      Appearance: Normal appearance.  HENT:     Head: Normocephalic and atraumatic.     Right Ear: Tympanic membrane normal.     Left Ear: Tympanic membrane normal.     Nose: Congestion and rhinorrhea present.     Mouth/Throat:     Mouth: Mucous membranes are moist.     Pharynx: Oropharynx is clear. No posterior oropharyngeal erythema.  Eyes:     Conjunctiva/sclera: Conjunctivae normal.  Cardiovascular:     Rate and Rhythm: Normal rate and regular rhythm.     Pulses: Normal pulses.     Heart sounds: Normal heart sounds.  Pulmonary:     Effort: Pulmonary effort is normal.     Breath sounds: Normal breath sounds. No wheezing, rhonchi or rales.  Musculoskeletal:     Cervical back: Normal range of motion and neck supple.  Lymphadenopathy:     Cervical: No cervical adenopathy.  Skin:    General: Skin is warm and dry.  Neurological:     Mental Status: She is alert and oriented to person, place, and time.  Psychiatric:        Mood and Affect: Mood normal.        Behavior: Behavior normal.        Thought Content: Thought content normal.  Judgment: Judgment normal.     No results found for any visits on 09/14/22.      Assessment & Plan:   Problem List Items Addressed This Visit   None Visit Diagnoses     Acute cough    -  Primary   Relevant Medications   amoxicillin-clavulanate (AUGMENTIN) 875-125 MG tablet   benzonatate (TESSALON) 200 MG capsule      Upper Respiratory Infection: Persistent cough for over a month with postnasal drainage and phlegm production. No fever, shortness of breath, or chest pain. Lungs clear on auscultation. -Start Augmentin for possible bacterial etiology given duration of cough and drainage -Continue Zyrtec for allergies. -Consider Mucinex  -Tessalon Perles for cough as needed. -Reevaluate if not improving after ABX         Meds ordered this encounter  Medications    amoxicillin-clavulanate (AUGMENTIN) 875-125 MG tablet    Sig: Take 1 tablet by mouth 2 (two) times daily.    Dispense:  20 tablet    Refill:  0    Order Specific Question:   Supervising Provider    Answer:   Danise Edge A [[id number]]   benzonatate (TESSALON) 200 MG capsule    Sig: Take 1 capsule (200 mg total) by mouth 2 (two) times daily as needed for cough.    Dispense:  20 capsule    Refill:  0    Order Specific Question:   Supervising Provider    Answer:   Danise Edge A [[id number]]    Return if symptoms worsen or fail to improve.  Clayborne Dana, NP

## 2023-03-28 ENCOUNTER — Encounter: Payer: Self-pay | Admitting: Family Medicine

## 2023-09-29 ENCOUNTER — Other Ambulatory Visit: Payer: Self-pay

## 2023-09-29 DIAGNOSIS — Z1211 Encounter for screening for malignant neoplasm of colon: Secondary | ICD-10-CM
